# Patient Record
Sex: Female | Born: 1985 | Race: Black or African American | Hispanic: No | Marital: Single | Smoking: Never smoker
Health system: Southern US, Community
[De-identification: ages and names within clinical notes are randomized; demographics above are authoritative.]

## PROBLEM LIST (undated history)

## (undated) ENCOUNTER — Inpatient Hospital Stay (HOSPITAL_COMMUNITY): Payer: Self-pay

## (undated) DIAGNOSIS — F329 Major depressive disorder, single episode, unspecified: Secondary | ICD-10-CM

## (undated) DIAGNOSIS — F32A Depression, unspecified: Secondary | ICD-10-CM

## (undated) DIAGNOSIS — R87629 Unspecified abnormal cytological findings in specimens from vagina: Secondary | ICD-10-CM

## (undated) DIAGNOSIS — I10 Essential (primary) hypertension: Secondary | ICD-10-CM

## (undated) HISTORY — DX: Major depressive disorder, single episode, unspecified: F32.9

## (undated) HISTORY — DX: Depression, unspecified: F32.A

## (undated) HISTORY — PX: COLPOSCOPY: SHX161

---
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
## 2006-05-14 ENCOUNTER — Ambulatory Visit: Payer: Self-pay | Admitting: Obstetrics and Gynecology

## 2006-05-28 ENCOUNTER — Ambulatory Visit: Payer: Self-pay | Admitting: Obstetrics and Gynecology

## 2006-05-28 ENCOUNTER — Other Ambulatory Visit: Admission: RE | Admit: 2006-05-28 | Discharge: 2006-05-28 | Payer: Self-pay | Admitting: Obstetrics and Gynecology

## 2006-05-28 ENCOUNTER — Encounter (INDEPENDENT_AMBULATORY_CARE_PROVIDER_SITE_OTHER): Payer: Self-pay | Admitting: Specialist

## 2006-06-25 ENCOUNTER — Ambulatory Visit: Payer: Self-pay | Admitting: Obstetrics and Gynecology

## 2006-07-01 HISTORY — PX: LEEP: SHX91

## 2006-12-24 ENCOUNTER — Ambulatory Visit: Payer: Self-pay | Admitting: Gynecology

## 2006-12-24 ENCOUNTER — Encounter (INDEPENDENT_AMBULATORY_CARE_PROVIDER_SITE_OTHER): Payer: Self-pay | Admitting: Gynecology

## 2007-06-08 ENCOUNTER — Emergency Department (HOSPITAL_COMMUNITY): Admission: EM | Admit: 2007-06-08 | Discharge: 2007-06-08 | Payer: Self-pay | Admitting: Emergency Medicine

## 2007-07-19 ENCOUNTER — Emergency Department (HOSPITAL_COMMUNITY): Admission: EM | Admit: 2007-07-19 | Discharge: 2007-07-19 | Payer: Self-pay | Admitting: Emergency Medicine

## 2007-07-21 ENCOUNTER — Inpatient Hospital Stay (HOSPITAL_COMMUNITY): Admission: AD | Admit: 2007-07-21 | Discharge: 2007-07-21 | Payer: Self-pay | Admitting: Obstetrics & Gynecology

## 2007-07-31 ENCOUNTER — Inpatient Hospital Stay (HOSPITAL_COMMUNITY): Admission: RE | Admit: 2007-07-31 | Discharge: 2007-07-31 | Payer: Self-pay | Admitting: Obstetrics & Gynecology

## 2007-09-16 ENCOUNTER — Inpatient Hospital Stay (HOSPITAL_COMMUNITY): Admission: AD | Admit: 2007-09-16 | Discharge: 2007-09-16 | Payer: Self-pay | Admitting: Gynecology

## 2007-10-26 ENCOUNTER — Ambulatory Visit (HOSPITAL_COMMUNITY): Admission: RE | Admit: 2007-10-26 | Discharge: 2007-10-26 | Payer: Self-pay | Admitting: Family Medicine

## 2008-01-04 ENCOUNTER — Ambulatory Visit: Payer: Self-pay | Admitting: Obstetrics and Gynecology

## 2008-01-04 ENCOUNTER — Inpatient Hospital Stay (HOSPITAL_COMMUNITY): Admission: AD | Admit: 2008-01-04 | Discharge: 2008-01-04 | Payer: Self-pay | Admitting: Obstetrics & Gynecology

## 2008-02-09 ENCOUNTER — Inpatient Hospital Stay (HOSPITAL_COMMUNITY): Admission: AD | Admit: 2008-02-09 | Discharge: 2008-02-10 | Payer: Self-pay | Admitting: Family Medicine

## 2008-02-09 ENCOUNTER — Ambulatory Visit: Payer: Self-pay | Admitting: Advanced Practice Midwife

## 2008-03-22 ENCOUNTER — Inpatient Hospital Stay (HOSPITAL_COMMUNITY): Admission: AD | Admit: 2008-03-22 | Discharge: 2008-03-22 | Payer: Self-pay | Admitting: Family Medicine

## 2008-03-22 ENCOUNTER — Ambulatory Visit: Payer: Self-pay | Admitting: Family Medicine

## 2008-03-23 ENCOUNTER — Inpatient Hospital Stay (HOSPITAL_COMMUNITY): Admission: AD | Admit: 2008-03-23 | Discharge: 2008-03-25 | Payer: Self-pay | Admitting: Obstetrics & Gynecology

## 2008-03-23 ENCOUNTER — Ambulatory Visit: Payer: Self-pay | Admitting: Obstetrics & Gynecology

## 2008-10-04 IMAGING — US US OB TRANSVAGINAL
1 series · 14 of 27 positions shown · non-contrast
Comparison: none

OBSTETRICAL ULTRASOUND:

 This ultrasound exam was performed in the [HOSPITAL] Ultrasound Department.  The OB US report was generated in the AS system, and faxed to the ordering physician.  This report is also available in [REDACTED] PACS.

[Series 1: us ob transvaginal · 0.14mm/px · 14 of 27 slices shown]
[im 1/27]
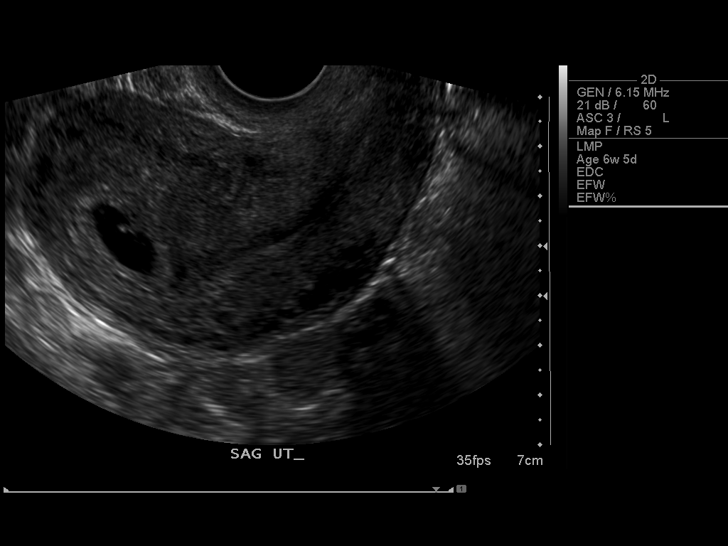
[im 3/27]
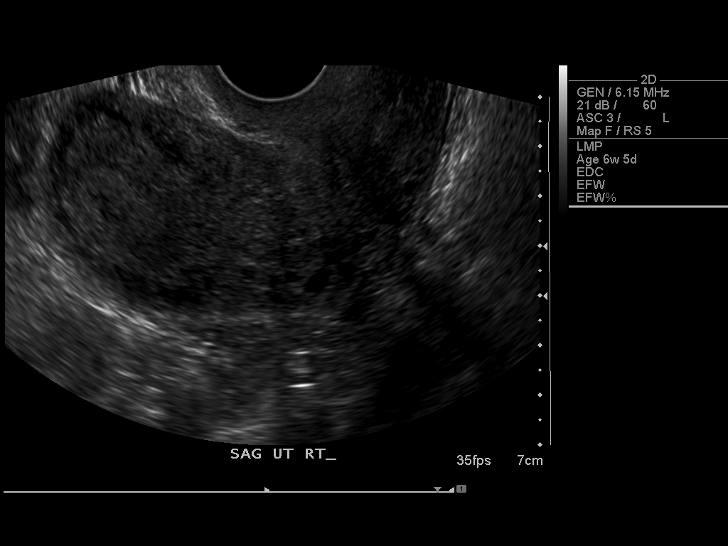
[im 5/27]
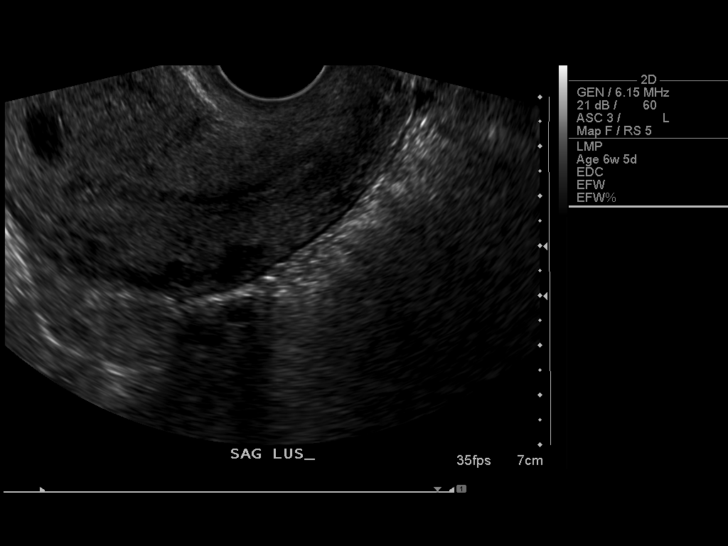
[im 7/27]
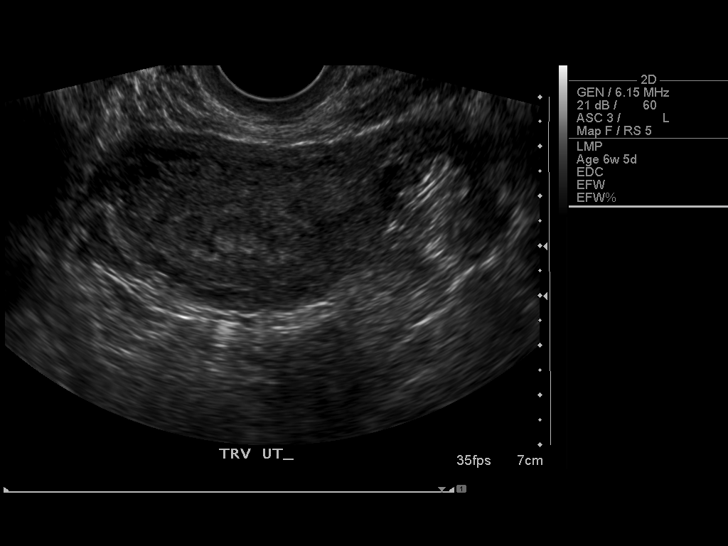
[im 9/27]
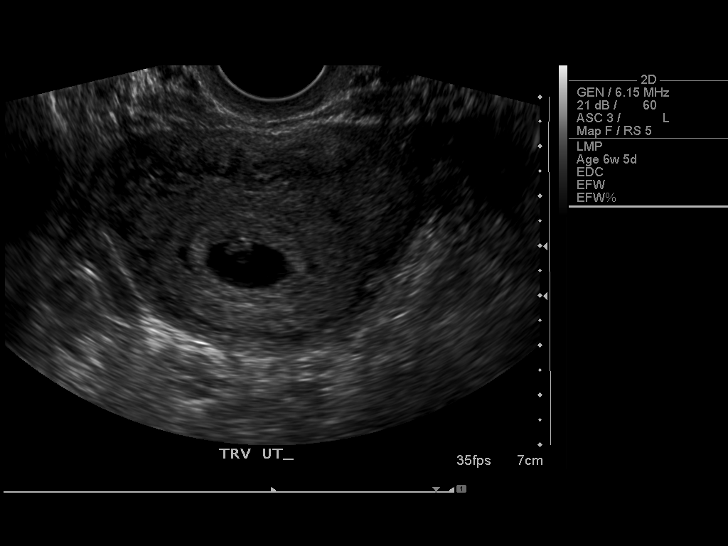
[im 11/27]
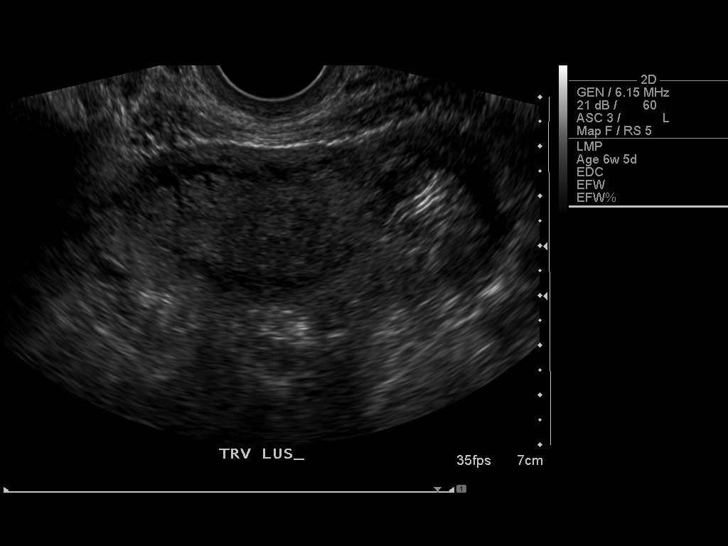
[im 13/27]
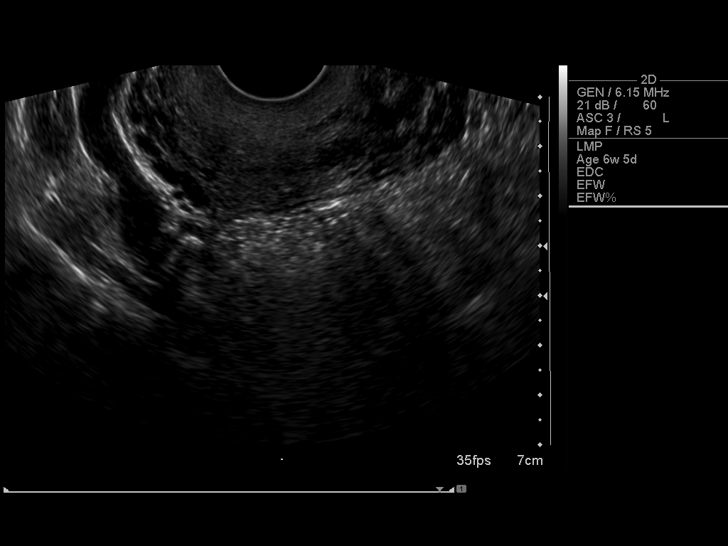
[im 15/27]
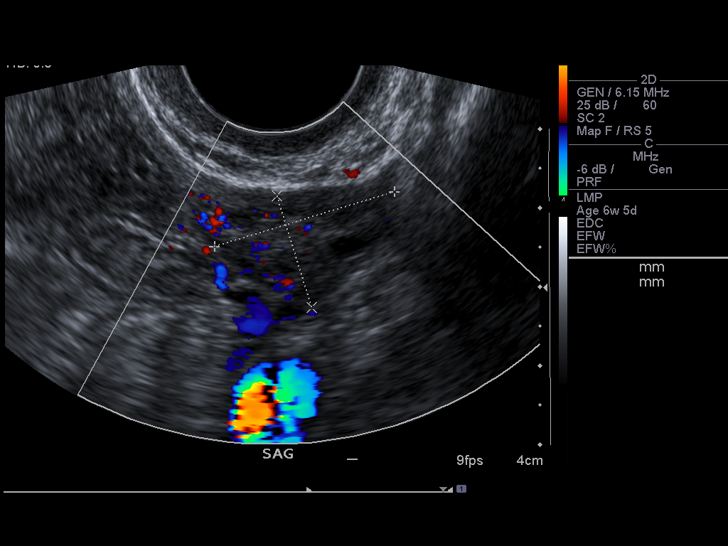
[im 17/27]
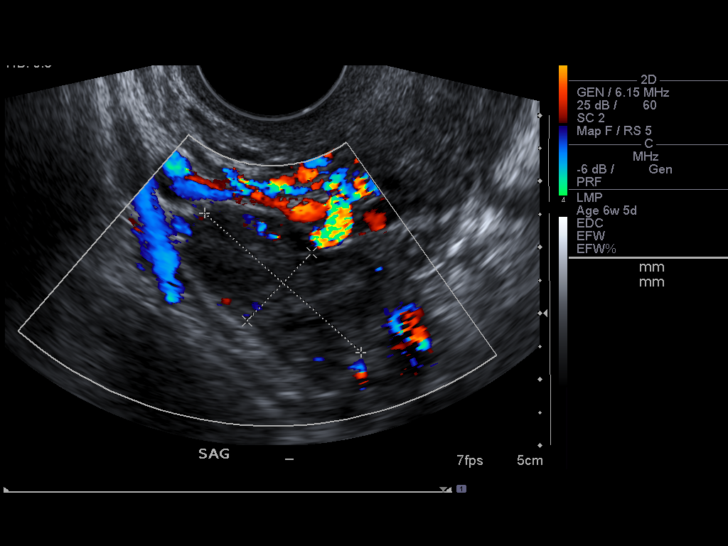
[im 19/27]
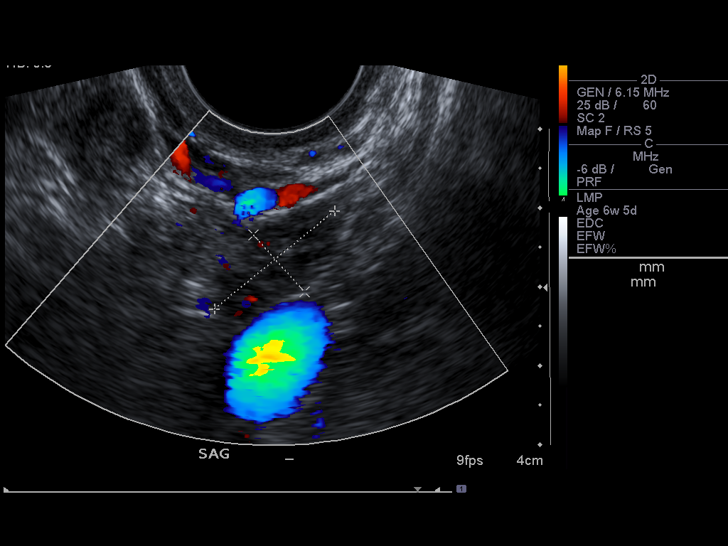
[im 21/27]
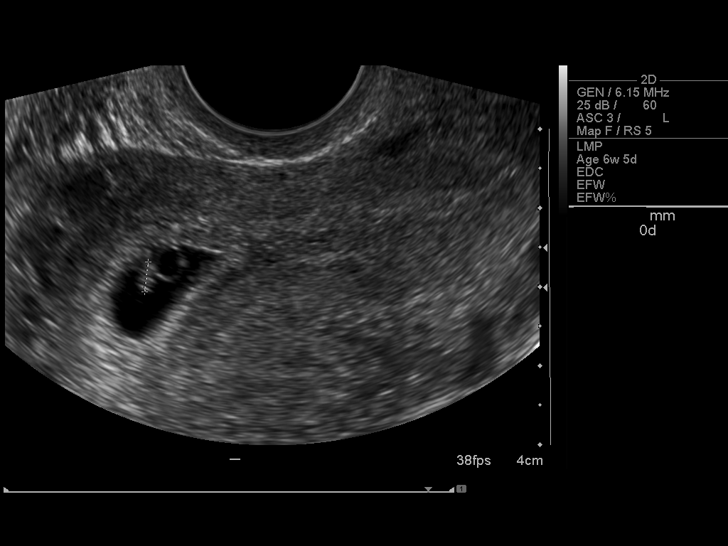
[im 23/27]
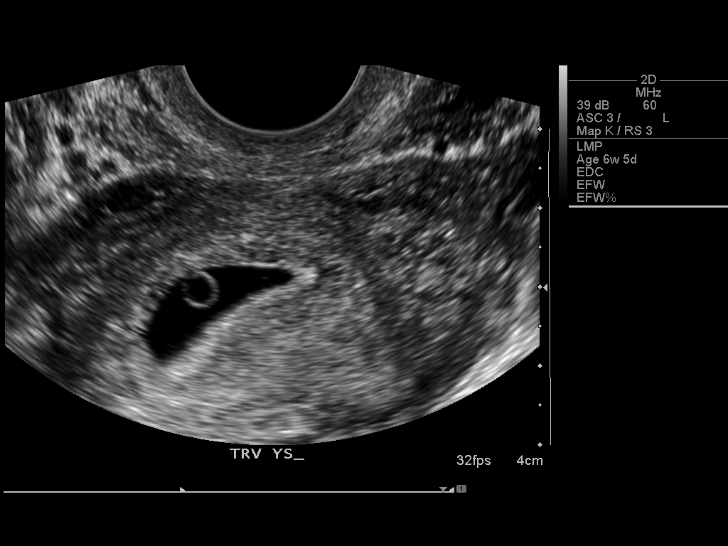
[im 25/27]
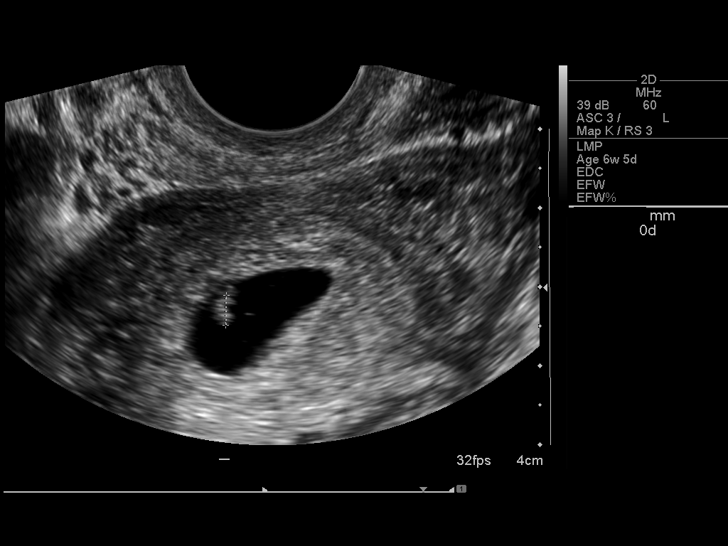
[im 27/27]
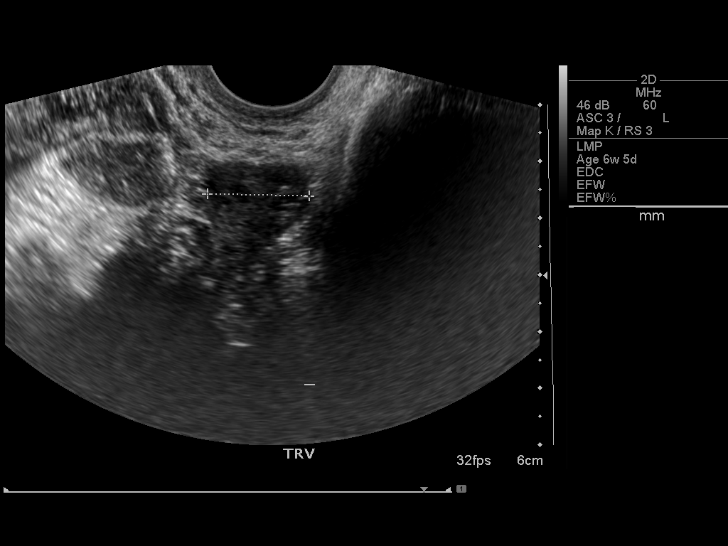

[14 of 27 positions shown; findings below may reference images not displayed]

IMPRESSION: See AS Obstetric US report.

## 2009-03-08 ENCOUNTER — Emergency Department (HOSPITAL_COMMUNITY): Admission: EM | Admit: 2009-03-08 | Discharge: 2009-03-08 | Payer: Self-pay | Admitting: Emergency Medicine

## 2010-10-05 LAB — URINE MICROSCOPIC-ADD ON

## 2010-10-05 LAB — URINALYSIS, ROUTINE W REFLEX MICROSCOPIC
Bilirubin Urine: NEGATIVE
Glucose, UA: NEGATIVE mg/dL
Hgb urine dipstick: NEGATIVE
Ketones, ur: 15 mg/dL — AB
Nitrite: POSITIVE — AB
Protein, ur: NEGATIVE mg/dL
Specific Gravity, Urine: 1.022 (ref 1.005–1.030)
Urobilinogen, UA: 1 mg/dL (ref 0.0–1.0)
pH: 7 (ref 5.0–8.0)

## 2010-10-05 LAB — URINE CULTURE: Colony Count: >=100000

## 2010-10-05 LAB — DIFFERENTIAL
Basophils Absolute: 0 10*3/uL (ref 0.0–0.1)
Basophils Relative: 0 % (ref 0–1)
Eosinophils Absolute: 0.1 10*3/uL (ref 0.0–0.7)
Eosinophils Relative: 1 % (ref 0–5)
Lymphocytes Relative: 29 % (ref 12–46)
Lymphs Abs: 2.6 10*3/uL (ref 0.7–4.0)
Monocytes Absolute: 0.9 10*3/uL (ref 0.1–1.0)
Monocytes Relative: 10 % (ref 3–12)
Neutro Abs: 5.3 10*3/uL (ref 1.7–7.7)
Neutrophils Relative %: 60 % (ref 43–77)

## 2010-10-05 LAB — BASIC METABOLIC PANEL
BUN: 7 mg/dL (ref 6–23)
CO2: 26 mEq/L (ref 19–32)
Calcium: 9.3 mg/dL (ref 8.4–10.5)
Chloride: 105 mEq/L (ref 96–112)
Creatinine, Ser: 0.72 mg/dL (ref 0.4–1.2)
GFR calc Af Amer: >60 mL/min (ref >60)
GFR calc non Af Amer: >60 mL/min (ref >60)
Glucose, Bld: 101 mg/dL — ABNORMAL HIGH (ref 70–99)
Potassium: 3.8 mEq/L (ref 3.5–5.1)
Sodium: 139 mEq/L (ref 135–145)

## 2010-10-05 LAB — CBC
HCT: 36.2 % (ref 36.0–46.0)
Hemoglobin: 12.3 g/dL (ref 12.0–15.0)
MCHC: 33.9 g/dL (ref 30.0–36.0)
MCV: 78.9 fL (ref 78.0–100.0)
Platelets: 181 10*3/uL (ref 150–400)
RBC: 4.59 MIL/uL (ref 3.87–5.11)
RDW: 12.4 % (ref 11.5–15.5)
WBC: 8.9 10*3/uL (ref 4.0–10.5)

## 2010-10-05 LAB — POCT PREGNANCY, URINE: Preg Test, Ur: NEGATIVE

## 2010-11-09 ENCOUNTER — Inpatient Hospital Stay (INDEPENDENT_AMBULATORY_CARE_PROVIDER_SITE_OTHER)
Admission: RE | Admit: 2010-11-09 | Discharge: 2010-11-09 | Disposition: A | Discharge status: Home / Self Care | Payer: Self-pay | Source: Ambulatory Visit | Attending: Emergency Medicine | Admitting: Emergency Medicine

## 2010-11-09 DIAGNOSIS — H109 Unspecified conjunctivitis: Secondary | ICD-10-CM

## 2010-11-13 NOTE — Group Therapy Note (Signed)
NAME:  Laura Downs, KRIGBAUM NO.:  [id number]   MEDICAL RECORD NO.:  [id number]          PATIENT TYPE:  WOC   LOCATION:  [facility]                   FACILITY:  [facility]   PHYSICIAN:  Ginger Carne, MD DATE OF BIRTH:  06/23/1986   DATE OF SERVICE:  12/24/2006                                  CLINIC NOTE   This is a 25 year old African American female here today for follow-up  Pap smear.  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  Cervix smooth without  erosions or lesions.  Pap smear is performed.  Uterus small, anteverted  and flexed and both adnexa palpable, found to be normal.   The patient had stopped her Depo-Provera contraceptive injection in  January 2008. Has no desire for contraception at this time is not  sexually active.  She has not had a menses to date and her urine  pregnancy test is pending.  She denies galactorrhea or headaches.  If  test is negative, the patient was advised that when she resumes  intercourse she should return for contraception.  The patient was  advised it may take up to 12-14 months before  menses resumes.           ______________________________  Ginger Carne, MD     SHB/MEDQ  D:  12/24/2006  T:  12/24/2006  Job:  [id number]

## 2010-11-16 NOTE — Group Therapy Note (Signed)
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   ON EXAMINATION:  The cervix looks as if she is a good candidate for clinic  LEEP procedure which we will be scheduling. I have talked to her about how  well she thinks she might tolerate this here in the clinic, and she thinks  she is able to do that. So will go ahead and schedule her in the near  future.  She should watch the film today and we answered the questions that  she had as well as explained to her in detail the possible complications as  well as the risk of preterm labor, postoperative bleeding, and infection.           ______________________________  Argentina Donovan, MD     PR/MEDQ  D:  05/14/2006  T:  05/14/2006  Job:  [id number]

## 2010-11-16 NOTE — Group Therapy Note (Signed)
NAME:  Laura Downs, Laura Downs NO.:  [id number]   MEDICAL RECORD NO.:  [id number]          PATIENT TYPE:  WOC   LOCATION:  [facility]                   FACILITY:  [facility]   PHYSICIAN:  Argentina Donovan, MD        DATE OF BIRTH:  Aug 16, 1985   DATE OF SERVICE:  05/28/2006                                  CLINIC NOTE   PROCEDURE:  Loop electrosurgical excision and conization of the cervix.   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   POSTOPERATIVE DIAGNOSIS:  Pending pathology report.   DESCRIPTION OF PROCEDURE:  With the patient in the dorsal lithotomy  position, an insulated speculum was placed in the vagina with lateral  wall speculum also and the cervix easily centered in the mid-portion of  both, injected with 1 mL of 1% Xylocaine with 1:200,000 epinephrine in 4  quadrants at 2, 4, 8 and 10 o'clock.  Using a Fischer LEEP extractor  with a 60-watt current at cutting 1 blend, a conization was taken; a 50  coagulation was then used to coagulate around the entire site as well as  in the biopsy site for hemostasis.  The speculum was removed and the  patient tolerated the procedure well and was discharged to be followed  in 2 weeks.           ______________________________  Argentina Donovan, MD     PR/MEDQ  D:  05/28/2006  T:  05/29/2006  Job:  [id number]

## 2011-03-21 LAB — CBC
HCT: 42.1
Hemoglobin: 14.7
MCHC: 34.8
MCV: 89.3
Platelets: 295
RBC: 4.71
RDW: 12.9
WBC: 5.3

## 2011-03-21 LAB — WET PREP, GENITAL
Trich, Wet Prep: NONE SEEN
WBC, Wet Prep HPF POC: NONE SEEN
Yeast Wet Prep HPF POC: NONE SEEN

## 2011-03-21 LAB — BASIC METABOLIC PANEL
BUN: 6
CO2: 26
Calcium: 9.7
Chloride: 106
Creatinine, Ser: 0.74
GFR calc Af Amer: >60
GFR calc non Af Amer: >60
Glucose, Bld: 97
Potassium: 3.5
Sodium: 140

## 2011-03-21 LAB — RPR: RPR Ser Ql: NONREACTIVE

## 2011-03-21 LAB — URINALYSIS, ROUTINE W REFLEX MICROSCOPIC
Bilirubin Urine: NEGATIVE
Glucose, UA: NEGATIVE
Hgb urine dipstick: NEGATIVE
Nitrite: NEGATIVE
Protein, ur: NEGATIVE
Specific Gravity, Urine: 1.028
Urobilinogen, UA: 1
pH: 6.5

## 2011-03-21 LAB — HCG, QUANTITATIVE, PREGNANCY
hCG, Beta Chain, Quant, S: 3234 — ABNORMAL HIGH
hCG, Beta Chain, Quant, S: 1604 — ABNORMAL HIGH

## 2011-03-21 LAB — DIFFERENTIAL
Basophils Absolute: 0
Eosinophils Relative: 3
Lymphocytes Relative: 37
Lymphs Abs: 1.9
Monocytes Absolute: 0.5
Neutro Abs: 2.7

## 2011-03-21 LAB — URINE MICROSCOPIC-ADD ON

## 2011-03-21 LAB — PREGNANCY, URINE: Preg Test, Ur: POSITIVE

## 2011-03-21 LAB — GC/CHLAMYDIA PROBE AMP, GENITAL
Chlamydia, DNA Probe: NEGATIVE
GC Probe Amp, Genital: NEGATIVE

## 2011-03-25 LAB — URINALYSIS, ROUTINE W REFLEX MICROSCOPIC
Protein, ur: NEGATIVE
Specific Gravity, Urine: 1.02
Urobilinogen, UA: 0.2

## 2011-03-28 LAB — URINALYSIS, ROUTINE W REFLEX MICROSCOPIC
Bilirubin Urine: NEGATIVE
Ketones, ur: NEGATIVE
Nitrite: NEGATIVE
Protein, ur: NEGATIVE
Urobilinogen, UA: 0.2

## 2011-03-28 LAB — FETAL FIBRONECTIN: Fetal Fibronectin: NEGATIVE

## 2011-03-29 LAB — URINALYSIS, ROUTINE W REFLEX MICROSCOPIC
Bilirubin Urine: NEGATIVE
Hgb urine dipstick: NEGATIVE
Ketones, ur: NEGATIVE
Nitrite: NEGATIVE
Protein, ur: NEGATIVE
Urobilinogen, UA: 0.2

## 2011-04-01 LAB — CBC
MCHC: 32.9
MCV: 88.8
Platelets: 257
RBC: 4.2
RDW: 14.4

## 2011-04-01 LAB — RPR: RPR Ser Ql: NONREACTIVE

## 2011-11-12 ENCOUNTER — Encounter: Payer: Medicaid Other | Admitting: Nurse Practitioner

## 2011-11-12 DIAGNOSIS — Z30017 Encounter for initial prescription of implantable subdermal contraceptive: Secondary | ICD-10-CM

## 2012-07-20 ENCOUNTER — Encounter: Payer: Medicaid Other | Admitting: Obstetrics and Gynecology

## 2012-07-30 ENCOUNTER — Encounter: Payer: Medicaid Other | Admitting: Obstetrics and Gynecology

## 2012-08-12 ENCOUNTER — Encounter: Payer: Self-pay | Admitting: Obstetrics and Gynecology

## 2012-08-12 ENCOUNTER — Ambulatory Visit (INDEPENDENT_AMBULATORY_CARE_PROVIDER_SITE_OTHER): Payer: Medicaid Other | Admitting: Obstetrics and Gynecology

## 2012-08-12 VITALS — BP 135/82 | HR 78 | Ht 63.5 in | Wt 183.0 lb

## 2012-08-12 DIAGNOSIS — Z30017 Encounter for initial prescription of implantable subdermal contraceptive: Secondary | ICD-10-CM

## 2012-08-12 DIAGNOSIS — Z3046 Encounter for surveillance of implantable subdermal contraceptive: Secondary | ICD-10-CM

## 2012-08-12 NOTE — Progress Notes (Signed)
Patient ID: Laura Downs, female   DOB: 09-30-1985, 27 y.o.   MRN: [id number] 27 yo G2P2 presenting today for Implanon removal and Nexplanon insertion. Patient has been very pleased with this form of birth control.   Removal Patient given informed consent for removal of her Implanon and reinsertion, time out was performed.  Signed copy in the chart.  Appropriate time out taken. Implanon site identified.  Area prepped in usual sterile fashon. One cc of 1% lidocaine was used to anesthetize the area at the distal end of the implant. A small stab incision was made right beside the implant on the distal portion.  The implanon rod was grasped using hemostats and removed without difficulty.  There was less than 3 cc blood loss. There were no complications.   Insertion Patient's right arm was prepped and draped in the usual sterile fashion. The incision site used for implanon removal was used for insertion as well. Nexplanon removed form packaging,  Device confirmed in needle, then inserted full length of needle and withdrawn per handbook instructions.  Pt insertion site covered with pressure dressing.   Minimal blood loss.  Pt tolerated the procedure well and was given post procedure instructions

## 2012-09-16 ENCOUNTER — Ambulatory Visit: Payer: Medicaid Other | Admitting: Obstetrics and Gynecology

## 2012-12-04 ENCOUNTER — Encounter: Payer: Medicaid Other | Admitting: Obstetrics and Gynecology

## 2013-09-05 ENCOUNTER — Emergency Department (HOSPITAL_COMMUNITY)
Admission: EM | Admit: 2013-09-05 | Discharge: 2013-09-05 | Disposition: A | Discharge status: Home / Self Care | Payer: Medicaid Other | Attending: Emergency Medicine | Admitting: Emergency Medicine

## 2013-09-05 ENCOUNTER — Encounter (HOSPITAL_COMMUNITY): Payer: Self-pay | Admitting: Emergency Medicine

## 2013-09-05 DIAGNOSIS — H109 Unspecified conjunctivitis: Secondary | ICD-10-CM | POA: Insufficient documentation

## 2013-09-05 MED ORDER — POLYMYXIN B-TRIMETHOPRIM 10000-0.1 UNIT/ML-% OP SOLN: 1.0000 [drp] | OPHTHALMIC | Status: DC

## 2013-09-05 NOTE — ED Notes (Signed)
Pt. Stated, My rt. Eye started swelling yesterday and this morning it was swollen shut.  I also have something that has little bites on me and i don't know where its coming from

## 2013-09-05 NOTE — Discharge Instructions (Signed)
Use polytrim drops as directed until symptoms resolve. Continue to use benadryl. Return to the ED with worsening or concerning symptoms. Refer to attached documents for more information.

## 2013-09-05 NOTE — ED Provider Notes (Signed)
Medical screening examination/treatment/procedure(s) were performed by non-physician practitioner and as supervising physician I was immediately available for consultation/collaboration.   EKG Interpretation None        Davia Smyre S Sarahgrace Broman, MD 09/05/13 2107 

## 2013-09-05 NOTE — ED Provider Notes (Signed)
CSN: [id number]     Arrival date & time 09/05/13  1020 History  This chart was scribed for Emilia BeckKaitlyn Gregroy Dombkowski, PA working with Junius ArgyleForrest S Harrison, MD by Quintella ReichertMatthew Underwood, ED Scribe. This patient was seen in room TR04C/TR04C and the patient's care was started at 10:56 AM.  Chief Complaint  Patient presents with  . Facial Swelling    The history is provided by the patient. No language interpreter was used.    HPI Comments: Laura Downs is a 28 y.o. female who presents to the Emergency Department complaining of right eyelid swelling that began last night.  Pt reports that she had mild swelling to her right eyelids last night.  She took Benadryl and went to sleep but this morning on waking her eye was "swollen shut" with discharge.  She denies pain to the eye.  She notes that 2 days ago her left eye also appeared slightly "pinkish-red" but this has since resolved.  She denies injury or FB in eye.  Pt notes that she has also had some itching in her ears bilaterally.     History reviewed. No pertinent past medical history.   Past Surgical History  Procedure Laterality Date  . Leep  2008    paps have been normal since.    No family history on file.   History  Substance Use Topics  . Smoking status: Never Smoker   . Smokeless tobacco: Never Used  . Alcohol Use: No    OB History   Grav Para Term Preterm Abortions TAB SAB Ect Mult Living   2 2 2  0 0 0 0 0 0 2       Review of Systems  HENT:       Ear itching  Eyes: Positive for discharge and redness. Negative for pain.       Right eyelid swelling  All other systems reviewed and are negative.      Allergies  Review of patient's allergies indicates no known allergies.  Home Medications   Current Outpatient Rx  Name  Route  Sig  Dispense  Refill  . Etonogestrel (IMPLANON [place])   Subcutaneous   Inject into the skin.          BP 126/69  Pulse 77  Temp(Src) 98.1 F (36.7 C) (Oral)  Resp 16  SpO2 98%  Physical  Exam  Nursing note and vitals reviewed. Constitutional: She is oriented to person, place, and time. She appears well-developed and well-nourished. No distress.  HENT:  Head: Normocephalic and atraumatic.  Right Ear: Tympanic membrane normal.  Left Ear: Tympanic membrane normal.  No rash noted on ears.  Eyes: EOM are normal. Right conjunctiva is injected.  Right periorbital swelling, without tenderness to palpation.  Mild conjunctival injection.  No drainage noted.  Small pustule at distal right eyebrow that is nontender to palpate.    Neck: Neck supple. No tracheal deviation present.  Cardiovascular: Normal rate.   Pulmonary/Chest: Effort normal. No respiratory distress.  Musculoskeletal: Normal range of motion.  Neurological: She is alert and oriented to person, place, and time.  Skin: Skin is warm and dry.  Psychiatric: She has a normal mood and affect. Her behavior is normal.    ED Course  Procedures (including critical care time)  DIAGNOSTIC STUDIES: Oxygen Saturation is 98% on room air, normal by my interpretation.    COORDINATION OF CARE: 11:00 AM-Discussed treatment plan which includes antibiotic eye drops with pt at bedside and pt agreed to plan.  Labs Review Labs Reviewed - No data to display  Imaging Review No results found.   EKG Interpretation None      MDM   Final diagnoses:  Conjunctivitis of right eye    11:05 AM Patient likely has conjunctivitis and will be treated with polytrim drops. Vitals stable and patient afebrile. No further evaluation needed at this time.    I personally performed the services described in this documentation, which was scribed in my presence. The recorded information has been reviewed and is accurate.   Emilia Beck, PA-C 09/05/13 1105

## 2014-04-27 ENCOUNTER — Emergency Department (HOSPITAL_COMMUNITY): Payer: Medicaid Other

## 2014-04-27 ENCOUNTER — Encounter (HOSPITAL_COMMUNITY): Payer: Self-pay | Admitting: Emergency Medicine

## 2014-04-27 ENCOUNTER — Emergency Department (HOSPITAL_COMMUNITY)
Admission: EM | Admit: 2014-04-27 | Discharge: 2014-04-27 | Disposition: A | Discharge status: Home / Self Care | Payer: Self-pay | Attending: Emergency Medicine | Admitting: Emergency Medicine

## 2014-04-27 DIAGNOSIS — Y939 Activity, unspecified: Secondary | ICD-10-CM | POA: Insufficient documentation

## 2014-04-27 DIAGNOSIS — T788XXA Other adverse effects, not elsewhere classified, initial encounter: Secondary | ICD-10-CM | POA: Insufficient documentation

## 2014-04-27 DIAGNOSIS — M7989 Other specified soft tissue disorders: Secondary | ICD-10-CM

## 2014-04-27 DIAGNOSIS — Y929 Unspecified place or not applicable: Secondary | ICD-10-CM | POA: Insufficient documentation

## 2014-04-27 DIAGNOSIS — T7840XA Allergy, unspecified, initial encounter: Secondary | ICD-10-CM

## 2014-04-27 DIAGNOSIS — R2232 Localized swelling, mass and lump, left upper limb: Secondary | ICD-10-CM | POA: Insufficient documentation

## 2014-04-27 DIAGNOSIS — X58XXXA Exposure to other specified factors, initial encounter: Secondary | ICD-10-CM | POA: Insufficient documentation

## 2014-04-27 LAB — COMPREHENSIVE METABOLIC PANEL
ALBUMIN: 3.7 g/dL (ref 3.5–5.2)
ALK PHOS: 62 U/L (ref 39–117)
ALT: 9 U/L (ref 0–35)
AST: 14 U/L (ref 0–37)
Anion gap: 12 (ref 5–15)
BILIRUBIN TOTAL: 0.5 mg/dL (ref 0.3–1.2)
BUN: 8 mg/dL (ref 6–23)
CHLORIDE: 103 meq/L (ref 96–112)
CO2: 24 meq/L (ref 19–32)
CREATININE: 0.83 mg/dL (ref 0.50–1.10)
Calcium: 8.9 mg/dL (ref 8.4–10.5)
GFR calc Af Amer: >90 mL/min (ref >90)
Glucose, Bld: 82 mg/dL (ref 70–99)
POTASSIUM: 3.3 meq/L — AB (ref 3.7–5.3)
Sodium: 139 mEq/L (ref 137–147)
Total Protein: 7.8 g/dL (ref 6.0–8.3)

## 2014-04-27 LAB — CBC WITH DIFFERENTIAL/PLATELET
BASOS ABS: 0 10*3/uL (ref 0.0–0.1)
Basophils Relative: 0 % (ref 0–1)
Eosinophils Absolute: 0.3 10*3/uL (ref 0.0–0.7)
Eosinophils Relative: 5 % (ref 0–5)
HEMATOCRIT: 39.1 % (ref 36.0–46.0)
HEMOGLOBIN: 13.3 g/dL (ref 12.0–15.0)
LYMPHS PCT: 43 % (ref 12–46)
Lymphs Abs: 2.2 10*3/uL (ref 0.7–4.0)
MCH: 30.1 pg (ref 26.0–34.0)
MCHC: 34 g/dL (ref 30.0–36.0)
MCV: 88.5 fL (ref 78.0–100.0)
MONO ABS: 0.4 10*3/uL (ref 0.1–1.0)
MONOS PCT: 7 % (ref 3–12)
NEUTROS ABS: 2.3 10*3/uL (ref 1.7–7.7)
NEUTROS PCT: 45 % (ref 43–77)
Platelets: 264 10*3/uL (ref 150–400)
RBC: 4.42 MIL/uL (ref 3.87–5.11)
RDW: 12.8 % (ref 11.5–15.5)
WBC: 5.2 10*3/uL (ref 4.0–10.5)

## 2014-04-27 LAB — I-STAT CG4 LACTIC ACID, ED: LACTIC ACID, VENOUS: 0.71 mmol/L (ref 0.5–2.2)

## 2014-04-27 MED ORDER — POTASSIUM CHLORIDE CRYS ER 20 MEQ PO TBCR
20.0000 meq | EXTENDED_RELEASE_TABLET | Freq: Once | ORAL | Status: AC
  Administered 2014-04-27: 20 meq via ORAL
  Filled 2014-04-27: qty 1

## 2014-04-27 MED ORDER — DIPHENHYDRAMINE HCL 25 MG PO TABS: 25.0000 mg | ORAL_TABLET | Freq: Four times a day (QID) | ORAL | Status: DC

## 2014-04-27 MED ORDER — PREDNISONE 10 MG PO TABS: 40.0000 mg | ORAL_TABLET | Freq: Every day | ORAL | Status: DC

## 2014-04-27 MED ORDER — DIPHENHYDRAMINE HCL 25 MG PO CAPS
25.0000 mg | ORAL_CAPSULE | Freq: Once | ORAL | Status: AC
  Administered 2014-04-27: 25 mg via ORAL
  Filled 2014-04-27: qty 1

## 2014-04-27 MED ORDER — PREDNISONE 20 MG PO TABS
40.0000 mg | ORAL_TABLET | Freq: Once | ORAL | Status: AC
  Administered 2014-04-27: 40 mg via ORAL
  Filled 2014-04-27: qty 2

## 2014-04-27 NOTE — ED Provider Notes (Signed)
CSN: [id number]     Arrival date & time 04/27/14  1724 History   First MD Initiated Contact with Patient 04/27/14 1933     Chief Complaint  Patient presents with  . Arm Pain     Patient is a 28 y.o. female presenting with rash. The history is provided by the patient.  Rash Location:  Shoulder/arm Shoulder/arm rash location:  L forearm Quality: blistering, itchiness and swelling   Quality: not bruising, not burning, not dry, not painful and not peeling   Severity:  Severe Onset quality:  Gradual Duration:  3 days Timing:  Constant Progression:  Worsening Chronicity:  New Context: not animal contact, not chemical exposure, not exposure to similar rash, not medications and not new detergent/soap   Relieved by:  None tried Worsened by:  Contact Ineffective treatments:  None tried Associated symptoms: no abdominal pain, no diarrhea, no fever, no headaches, no joint pain, no nausea, no sore throat, no throat swelling, no tongue swelling, no URI and not vomiting     History reviewed. No pertinent past medical history. Past Surgical History  Procedure Laterality Date  . Leep  2008    paps have been normal since.   History reviewed. No pertinent family history. History  Substance Use Topics  . Smoking status: Never Smoker   . Smokeless tobacco: Never Used  . Alcohol Use: No   OB History   Grav Para Term Preterm Abortions TAB SAB Ect Mult Living   2 2 2  0 0 0 0 0 0 2     Review of Systems  Constitutional: Negative for fever.  HENT: Negative for sore throat.   Gastrointestinal: Negative for nausea, vomiting, abdominal pain and diarrhea.  Musculoskeletal: Negative for arthralgias.  Skin: Positive for rash.  Neurological: Negative for headaches.  All other systems reviewed and are negative.    Allergies  Review of patient's allergies indicates no known allergies.  Home Medications   Prior to Admission medications   Medication Sig Start Date End Date Taking?  Authorizing Provider  Etonogestrel (IMPLANON [place]) Inject into the skin.   Yes Historical Provider, MD   BP 129/70  Pulse 86  Temp(Src) 97.5 F (36.4 C) (Oral)  Resp 18  SpO2 98% Physical Exam  Constitutional: She is oriented to person, place, and time. She appears well-developed and well-nourished. She is cooperative. No distress.  HENT:  Head: Normocephalic and atraumatic.  Right Ear: External ear normal.  Left Ear: External ear normal.  Neck: Normal range of motion and phonation normal.  Cardiovascular: Normal rate and regular rhythm.   Pulmonary/Chest: Effort normal and breath sounds normal. No respiratory distress. She has no wheezes. She has no rales.  Abdominal: Soft. She exhibits no distension. There is no tenderness. There is no rebound and no guarding.  Musculoskeletal:       Arms: Neurological: She is alert and oriented to person, place, and time.  Skin: Skin is warm and dry. No rash noted. She is not diaphoretic.    ED Course  Procedures  Labs Review  Results for orders placed during the hospital encounter of 04/27/14  CBC WITH DIFFERENTIAL      Result Value Ref Range   WBC 5.2  4.0 - 10.5 K/uL   RBC 4.42  3.87 - 5.11 MIL/uL   Hemoglobin 13.3  12.0 - 15.0 g/dL   HCT 40.939.1  81.136.0 - 91.446.0 %   MCV 88.5  78.0 - 100.0 fL   MCH 30.1  26.0 - 34.0  pg   MCHC 34.0  30.0 - 36.0 g/dL   RDW 16.112.8  09.611.5 - 04.515.5 %   Platelets 264  150 - 400 K/uL   Neutrophils Relative % 45  43 - 77 %   Neutro Abs 2.3  1.7 - 7.7 K/uL   Lymphocytes Relative 43  12 - 46 %   Lymphs Abs 2.2  0.7 - 4.0 K/uL   Monocytes Relative 7  3 - 12 %   Monocytes Absolute 0.4  0.1 - 1.0 K/uL   Eosinophils Relative 5  0 - 5 %   Eosinophils Absolute 0.3  0.0 - 0.7 K/uL   Basophils Relative 0  0 - 1 %   Basophils Absolute 0.0  0.0 - 0.1 K/uL  COMPREHENSIVE METABOLIC PANEL      Result Value Ref Range   Sodium 139  137 - 147 mEq/L   Potassium 3.3 (*) 3.7 - 5.3 mEq/L   Chloride 103  96 - 112 mEq/L   CO2 24  19  - 32 mEq/L   Glucose, Bld 82  70 - 99 mg/dL   BUN 8  6 - 23 mg/dL   Creatinine, Ser 4.090.83  0.50 - 1.10 mg/dL   Calcium 8.9  8.4 - 81.110.5 mg/dL   Total Protein 7.8  6.0 - 8.3 g/dL   Albumin 3.7  3.5 - 5.2 g/dL   AST 14  0 - 37 U/L   ALT 9  0 - 35 U/L   Alkaline Phosphatase 62  39 - 117 U/L   Total Bilirubin 0.5  0.3 - 1.2 mg/dL   GFR calc non Af Amer >90  >90 mL/min   GFR calc Af Amer >90  >90 mL/min   Anion gap 12  5 - 15  I-STAT CG4 LACTIC ACID, ED      Result Value Ref Range   Lactic Acid, Venous 0.71  0.5 - 2.2 mmol/L     Imaging Review Dg Forearm Left  04/27/2014   CLINICAL DATA:  Multiple small wounds on forearm. Swelling. Symptoms started 2 days ago.  EXAM: LEFT FOREARM - 2 VIEW  COMPARISON:  None.  FINDINGS: There is no evidence of fracture or other focal bone lesions. Soft tissues are unremarkable.  IMPRESSION: Negative.   Electronically Signed   By: Charlett NoseKevin  Dover M.D.   On: 04/27/2014 20:39    MDM   Final diagnoses:  Left arm swelling  Allergic reaction, initial encounter    28 y.o. otherwise healthy female presents to the ED due to left arm itching, swelling, blister formation. Denies any history of same. Denies pain, fever, chills, chest pain, shortness of breath, nausea, vomiting, diarrhea, oropharyngeal swelling.  X-ray obtained which showed no evidence of fracture. I have no concern for compartment syndrome, DVT. Her history and presentation are consistent with an allergic reaction versus contact dermatitis. She was given Benadryl and prednisone in the ED with complete relief of her itching.  I discussed very strict return precautions with the patient which were given in writing as well. She was discharged with a course of Benadryl and prednisone. She is to follow-up with the primary care physician (resources given).  This case was managed in conjunction with my attending, Dr. Patria Maneampos.    Maxine GlennAnn Jailyn Langhorst, MD 04/28/14 [phone]

## 2014-04-27 NOTE — ED Notes (Signed)
Pt verbalizes understanding of d/c instructions and directions for follow-up care. Pt voices no additional concerns or questions at this time. 

## 2014-04-27 NOTE — ED Notes (Signed)
Patient transported to X-ray 

## 2014-04-27 NOTE — ED Notes (Signed)
Pt having left arm pain, swelling and blisters since Monday. Pt left FA very tight, swollen. sts she has popped multiple blisters.

## 2014-04-27 NOTE — ED Notes (Signed)
Pt states she noted her left arm started itching yesterday and a few blisters "popped up" over the last 24 hrs, as well as some swelling noticed in her left forearm. Pt states she is not in any pain, but the swelling is causing some discomfort.

## 2014-04-27 NOTE — ED Notes (Signed)
Pt remains monitored by blood pressure and pulse ox.  

## 2014-04-27 NOTE — ED Notes (Signed)
Pt placed into gown and on monitor upon arrival to room.  Pt monitored by blood pressure and pulse  Ox.

## 2014-04-27 NOTE — Discharge Instructions (Signed)
Take benadryl every 6 hours for itching. Return to the ED for any new or worsening symptoms such as difficulty breathing, fever, chills, nausea, vomiting, or any other concerns. Follow up with a primary care provider. See resource guide below to establish with a primary care provider.   Rash A rash is a change in the color or texture of your skin. There are many different types of rashes. You may have other problems that accompany your rash. CAUSES   Infections.  Allergic reactions. This can include allergies to pets or foods.  Certain medicines.  Exposure to certain chemicals, soaps, or cosmetics.  Heat.  Exposure to poisonous plants.  Tumors, both cancerous and noncancerous. SYMPTOMS   Redness.  Scaly skin.  Itchy skin.  Dry or cracked skin.  Bumps.  Blisters.  Pain. DIAGNOSIS  Your caregiver may do a physical exam to determine what type of rash you have. A skin sample (biopsy) may be taken and examined under a microscope. TREATMENT  Treatment depends on the type of rash you have. Your caregiver may prescribe certain medicines. For serious conditions, you may need to see a skin doctor (dermatologist). HOME CARE INSTRUCTIONS   Avoid the substance that caused your rash.  Do not scratch your rash. This can cause infection.  You may take cool baths to help stop itching.  Only take over-the-counter or prescription medicines as directed by your caregiver.  Keep all follow-up appointments as directed by your caregiver. SEEK IMMEDIATE MEDICAL CARE IF:  You have increasing pain, swelling, or redness.  You have a fever.  You have new or severe symptoms.  You have body aches, diarrhea, or vomiting.  Your rash is not better after 3 days. MAKE SURE YOU:  Understand these instructions.  Will watch your condition.  Will get help right away if you are not doing well or get worse. Document Released: 06/07/2002 Document Revised: 09/09/2011 Document Reviewed:  04/01/2011 [facility] Patient Information 2015 [employer], [place]. This information is not intended to replace advice given to you by your health care provider. Make sure you discuss any questions you have with your health care provider.    Emergency Department Resource Guide 1) Find a Doctor and Pay Out of Pocket Although you won't have to find out who is covered by your insurance plan, it is a good idea to ask around and get recommendations. You will then need to call the office and see if the doctor you have chosen will accept you as a new patient and what types of options they offer for patients who are self-pay. Some doctors offer discounts or will set up payment plans for their patients who do not have insurance, but you will need to ask so you aren't surprised when you get to your appointment.  2) Contact Your Local Health Department Not all health departments have doctors that can see patients for sick visits, but many do, so it is worth a call to see if yours does. If you don't know where your local health department is, you can check in your phone book. The CDC also has a tool to help you locate your state's health department, and many state websites also have listings of all of their local health departments.  3) Find a Walk-in Clinic If your illness is not likely to be very severe or complicated, you may want to try a walk in clinic. These are popping up all over the country in pharmacies, drugstores, and shopping centers. They're usually staffed by nurse  practitioners or physician assistants that have been trained to treat common illnesses and complaints. They're usually fairly quick and inexpensive. However, if you have serious medical issues or chronic medical problems, these are probably not your best option.  No Primary Care Doctor: - Call Health Connect at  [phone] - they can help you locate a primary care doctor that  accepts your insurance, provides certain services, etc. - Physician  Referral Service- [phone]  Chronic Pain Problems: Organization         Address  Phone   Notes  [facility]  [phone] Patients need to be referred by their primary care doctor.   Medication Assistance: Organization         Address  Phone   Notes  [facility] Medication [facility] [address] [place] [place], [place] [address] [phone] --Must be a resident of [facility] -- Must have NO insurance coverage whatsoever (no Medicaid/ Medicare, etc.) -- The pt. MUST have a primary care doctor that directs their care regularly and follows them in the community   [facility]  [phone]   [employer]  [phone]    Agencies that provide inexpensive medical care: Organization         Address  Phone   Notes  [facility]  [phone]   [facility]    [phone]   [facility] [address] [place], [place] [address] [phone]   [facility] [address] [place] [phone]   Planned Parenthood    [phone]   [facility]    [phone]   [facility] and [facility]  [address], [place] Phone:  [phone], Fax:  [phone] Hours of Operation:  9 am - 6 pm, M-F.  Also accepts Medicaid/Medicare and self-pay.  [facility]  [address], [place] Phone: [phone], Fax: [phone]. Hours of Operation:  8:30 am - 5:30 pm, M-F.  Also accepts Medicaid and self-pay.  [facility] [place] [address], [place] Phone: [phone]   [facility] [address] Natasha Bence [place], [place] [phone], Ext. [phone] Mondays & Thursdays: 7-9 AM.  First 15 patients are seen on a first come, first serve basis.    Medicaid-accepting [facility] Providers:  Organization         Address  Phone   Notes  [facility] [address], [place] [phone] Also accepts self-pay patients.  [facility] [address] Laurell Josephs [place], [place]  [phone]   [facility] [address], [place] [phone]   [facility] [address], [place] [phone]   Renaye Rakers [address], [place]   [phone] Only accepts [place] Access [place] patients after they have their name applied to their card.   Self-Pay (no insurance) in [facility]:  Organization         Address  Phone   Notes  Sickle Cell Patients, [facility] Medicine [address] [place], [place] [phone]   [facility] [address] [place], [place] [phone]   [facility] [place]  [address], [place] [phone]   [facility]/Dr. Julio Sicks  [address], [place] or [address], [place] [phone] Phone number for both [place] and [place] locations is the same.  [facility] [address] [place] [phone]   [facility] [address], [place] or [address] [phone]   [facility] [address] [place] [phone], phone; [phone], fax Sees patients 1st and 3rd Saturday of every month.  Must not qualify for public or private insurance (i.e. Medicaid, Medicare, [place] Health Choice, Veterans' Benefits)  Household income should be no more than 200% of the poverty level The clinic cannot treat you if you are pregnant or think you are pregnant  Sexually transmitted diseases are not treated at the clinic.    Dental Care: Organization         Address  Phone  Notes  [facility] Department of [facility] [address] [place], [place] [phone] Accepts children up to age 103 who are enrolled  in [place] or [place] Health Choice; pregnant women with a Medicaid card; and children who have applied for Medicaid or [place] Health Choice, but were declined, whose parents can pay a reduced fee at time of service.  [facility] Department of [facility]  [address], [place] [phone] Accepts children up to age 67 who are enrolled in [place] or [place] Health Choice; pregnant women with a Medicaid card; and children who have applied for Medicaid or [place] Health Choice, but were declined, whose parents can pay a reduced fee at time of service.  [facility]  [address] [place], [place] [phone] Patients are seen by appointment only. Walk-ins are not accepted. [facility] will see patients 70 years of age and older. Monday - Tuesday (8am-5pm) Most Wednesdays (8:30-5pm) $30 per visit, cash only  [facility]  [address], [facility] [phone] Patients are seen by appointment only. Walk-ins are not accepted. [facility] will see patients 36 years of age and older. One Wednesday Evening (Monthly: Volunteer Based).  $30 per visit, cash only  [employer]  [phone] for adults; Children under age 56, call Graduate Pediatric Dentistry at [phone]. Children aged 18-14, please call [phone] to request a pediatric application.  Dental services are provided in all areas of dental care including fillings, crowns and bridges, complete and partial dentures, implants, gum treatment, root canals, and extractions. Preventive care is also provided. Treatment is provided to both adults and children. Patients are selected via a lottery and there is often a waiting list.   [facility] [address], [place]  [phone] [email or web address]   [facility] [address] [place], [place] [phone], Ext. [phone] Second and Fourth Thursday of each month, opens at  6:30 AM; Clinic ends at 9 AM.  Patients are seen on a first-come first-served basis, and a limited number are seen during each clinic.   [facility]  [address] Ether Griffins [place], [place] [phone]   Eligibility Requirements You must have lived in [place], [place], or [place] counties for at least the last three months.   You cannot be eligible for state or federal sponsored [place], including [employer], [place], or [employer].   You generally cannot be eligible for healthcare insurance through your employer.  How to apply: Eligibility screenings are held every Tuesday and Wednesday afternoon from 1:00 pm until 4:00 pm. You do not need an appointment for the interview!  [facility] [address] [place], [place] [phone]   [facility] Health Department  [phone]   [facility] Health Department  [phone]   [facility] Health Department  [phone]    Behavioral Health Resources in the Community: Intensive Outpatient Programs Organization         Address  Phone  Notes  [facility] [address] [place], [place] [phone]   [facility] Outpatient [address], [place], [place] [phone]   [facility] [address], [place], [place]  [phone]   [facility] Mental Health [address],  [place], [place] [phone] or [phone]   Substance Abuse Resources Organization         Address  Phone  Notes  [facility] Drug Services  [phone]   [facility]  [phone]   [facility] [place]  [phone]   Floydene Flock  [phone]   Residential & Outpatient Substance Abuse Program  [phone]   Psychological Services Organization         Address  Phone  Notes  [facility] Behavioral Health  [phone]   [facility]  [phone]   [facility] Mental Health [address], [place]  [phone] or [phone]    Mobile Crisis Teams Organization         Address  Phone  Notes  [facility], Mobile Crisis Care Unit  [phone]   [facility]  [address] [place], [place] [phone]   Doristine Locks [address] [place] [phone]    Self-Help/Support Groups Organization         Address  Phone             Notes  [facility] [place] - variety of support groups  [phone]- [phone] Call for more information  Narcotics Anonymous (NA), [facility] [address], [employer]  2 meetings at this location   Statistician         Address  Phone  Notes  [facility] [address],    [place]  [phone]   [facility]  [address] [place] [address], [place], [place] [phone]   [facility] [address] [place], [place] [phone] Admissions: 8am-3pm M-F  [facility] [address],    [place], [place] [phone]   [facility] [address] [place], [place], [place] [phone]   [facility] [address],  [place], [place] [phone]   [facility] - Intensive Outpatient [address], [place], [place] [phone]   [facility] (Addiction Recovery [facility] [address] [place]  [place], [place] [phone] or [phone]   [facility] ([facility]) [address], [place], [place] [phone] Accepts Medicaid  [facility] [address]  [place] [phone] Substance Abuse/Addiction Treatment   [facility] Organization         Address  Phone  Notes  [facility]  [phone]   Angie Fava, PhD [address] Knack [place], [place]   [phone] or [phone]   [facility] Behavioral   [address] [place], [place] [phone]   [facility]  [address], [place], [place] [phone] Insurance/Medicaid/sponsorship through [employer]  [facility] [address]                                    [place], [place] [phone] Therapy/tele-psych/case  [facility] [address]   [place], [place] [phone]    Dr. Lolly MustacheArfeen  [phone]) [phone]   [facility]  [facility] 1) [address], [place] 2) [address], [place] 3)  [address], [place] [phone]) [phone]  [phone]   [facility] Child Abuse Hotline [phone]) [phone] or [phone] (After Hours)

## 2014-04-28 NOTE — ED Provider Notes (Signed)
I saw and evaluated the patient, reviewed the resident's note and I agree with the findings and plan.   EKG Interpretation None      I suspect this is a contact dermatitis given the itching component of it.  Compartments are soft.  Normal radial pulse.  Normal grip strength.  No compartment syndrome.  This is not infectious appearing.  Discharge home with Benadryl and steroids  Lyanne CoKevin M Leolia Vinzant, MD 04/28/14 1510

## 2014-05-02 ENCOUNTER — Encounter (HOSPITAL_COMMUNITY): Payer: Self-pay | Admitting: Emergency Medicine

## 2014-07-22 ENCOUNTER — Ambulatory Visit: Payer: Medicaid Other | Admitting: Obstetrics & Gynecology

## 2014-07-27 ENCOUNTER — Ambulatory Visit (INDEPENDENT_AMBULATORY_CARE_PROVIDER_SITE_OTHER): Payer: Medicaid Other | Admitting: Obstetrics and Gynecology

## 2014-07-27 ENCOUNTER — Encounter: Payer: Self-pay | Admitting: Obstetrics and Gynecology

## 2014-07-27 VITALS — BP 126/87 | HR 66 | Ht 63.0 in | Wt 196.2 lb

## 2014-07-27 DIAGNOSIS — Z3046 Encounter for surveillance of implantable subdermal contraceptive: Secondary | ICD-10-CM

## 2014-07-27 DIAGNOSIS — Z308 Encounter for other contraceptive management: Secondary | ICD-10-CM

## 2014-07-27 NOTE — Patient Instructions (Signed)
Contraception Choices Contraception (birth control) is the use of any methods or devices to prevent pregnancy. Below are some methods to help avoid pregnancy. HORMONAL METHODS   Contraceptive implant. This is a thin, plastic tube containing progesterone hormone. It does not contain estrogen hormone. Your health care provider inserts the tube in the inner part of the upper arm. The tube can remain in place for up to 3 years. After 3 years, the implant must be removed. The implant prevents the ovaries from releasing an egg (ovulation), thickens the cervical mucus to prevent sperm from entering the uterus, and thins the lining of the inside of the uterus.  Progesterone-only injections. These injections are given every 3 months by your health care provider to prevent pregnancy. This synthetic progesterone hormone stops the ovaries from releasing eggs. It also thickens cervical mucus and changes the uterine lining. This makes it harder for sperm to survive in the uterus.  Birth control pills. These pills contain estrogen and progesterone hormone. They work by preventing the ovaries from releasing eggs (ovulation). They also cause the cervical mucus to thicken, preventing the sperm from entering the uterus. Birth control pills are prescribed by a health care provider.Birth control pills can also be used to treat heavy periods.  Minipill. This type of birth control pill contains only the progesterone hormone. They are taken every day of each month and must be prescribed by your health care provider.  Birth control patch. The patch contains hormones similar to those in birth control pills. It must be changed once a week and is prescribed by a health care provider.  Vaginal ring. The ring contains hormones similar to those in birth control pills. It is left in the vagina for 3 weeks, removed for 1 week, and then a new one is put back in place. The patient must be comfortable inserting and removing the ring  from the vagina.A health care provider's prescription is necessary.  Emergency contraception. Emergency contraceptives prevent pregnancy after unprotected sexual intercourse. This pill can be taken right after sex or up to 5 days after unprotected sex. It is most effective the sooner you take the pills after having sexual intercourse. Most emergency contraceptive pills are available without a prescription. Check with your pharmacist. Do not use emergency contraception as your only form of birth control. BARRIER METHODS   Female condom. This is a thin sheath (latex or rubber) that is worn over the penis during sexual intercourse. It can be used with spermicide to increase effectiveness.  Female condom. This is a soft, loose-fitting sheath that is put into the vagina before sexual intercourse.  Diaphragm. This is a soft, latex, dome-shaped barrier that must be fitted by a health care provider. It is inserted into the vagina, along with a spermicidal jelly. It is inserted before intercourse. The diaphragm should be left in the vagina for 6 to 8 hours after intercourse.  Cervical cap. This is a round, soft, latex or plastic cup that fits over the cervix and must be fitted by a health care provider. The cap can be left in place for up to 48 hours after intercourse.  Sponge. This is a soft, circular piece of polyurethane foam. The sponge has spermicide in it. It is inserted into the vagina after wetting it and before sexual intercourse.  Spermicides. These are chemicals that kill or block sperm from entering the cervix and uterus. They come in the form of creams, jellies, suppositories, foam, or tablets. They do not require a   prescription. They are inserted into the vagina with an applicator before having sexual intercourse. The process must be repeated every time you have sexual intercourse. INTRAUTERINE CONTRACEPTION  Intrauterine device (IUD). This is a T-shaped device that is put in a woman's uterus  during a menstrual period to prevent pregnancy. There are 2 types:  Copper IUD. This type of IUD is wrapped in copper wire and is placed inside the uterus. Copper makes the uterus and fallopian tubes produce a fluid that kills sperm. It can stay in place for 10 years.  Hormone IUD. This type of IUD contains the hormone progestin (synthetic progesterone). The hormone thickens the cervical mucus and prevents sperm from entering the uterus, and it also thins the uterine lining to prevent implantation of a fertilized egg. The hormone can weaken or kill the sperm that get into the uterus. It can stay in place for 3-5 years, depending on which type of IUD is used. PERMANENT METHODS OF CONTRACEPTION  Female tubal ligation. This is when the woman's fallopian tubes are surgically sealed, tied, or blocked to prevent the egg from traveling to the uterus.  Hysteroscopic sterilization. This involves placing a small coil or insert into each fallopian tube. Your doctor uses a technique called hysteroscopy to do the procedure. The device causes scar tissue to form. This results in permanent blockage of the fallopian tubes, so the sperm cannot fertilize the egg. It takes about 3 months after the procedure for the tubes to become blocked. You must use another form of birth control for these 3 months.  Female sterilization. This is when the female has the tubes that carry sperm tied off (vasectomy).This blocks sperm from entering the vagina during sexual intercourse. After the procedure, the man can still ejaculate fluid (semen). NATURAL PLANNING METHODS  Natural family planning. This is not having sexual intercourse or using a barrier method (condom, diaphragm, cervical cap) on days the woman could become pregnant.  Calendar method. This is keeping track of the length of each menstrual cycle and identifying when you are fertile.  Ovulation method. This is avoiding sexual intercourse during ovulation.  Symptothermal  method. This is avoiding sexual intercourse during ovulation, using a thermometer and ovulation symptoms.  Post-ovulation method. This is timing sexual intercourse after you have ovulated. Regardless of which type or method of contraception you choose, it is important that you use condoms to protect against the transmission of sexually transmitted infections (STIs). Talk with your health care provider about which form of contraception is most appropriate for you. Document Released: 06/17/2005 Document Revised: 06/22/2013 Document Reviewed: 12/10/2012 ExitCare Patient Information 2015 ExitCare, LLC. This information is not intended to replace advice given to you by your health care provider. Make sure you discuss any questions you have with your health care provider.  

## 2014-07-27 NOTE — Progress Notes (Signed)
29 yo here for Nexplanon removal which she has had for almost 3 years. She has experienced irregular bleeding over the past few months  Removal Patient given informed consent for removal of her Implanon, time out was performed.  Signed copy in the chart.  Appropriate time out taken. Implanon site identified.  Area prepped in usual sterile fashon. One cc of 1% lidocaine was used to anesthetize the area at the distal end of the implant. A small stab incision was made right beside the implant on the distal portion.  The implanon rod was grasped using hemostats and removed without difficulty.  There was less than 3 cc blood loss. There were no complications.  A small amount of antibiotic ointment and steri-strips were applied over the small incision.  A pressure bandage was applied to reduce any bruising.  The patient tolerated the procedure well and was given post procedure instructions.  Patient does not want any contraception at this time.

## 2015-03-13 ENCOUNTER — Encounter (HOSPITAL_COMMUNITY): Payer: Self-pay | Admitting: *Deleted

## 2015-03-13 ENCOUNTER — Inpatient Hospital Stay (HOSPITAL_COMMUNITY)
Admission: AD | Admit: 2015-03-13 | Discharge: 2015-03-13 | Disposition: A | Discharge status: Home / Self Care | Payer: Medicaid Other | Source: Ambulatory Visit | Attending: Family Medicine | Admitting: Family Medicine

## 2015-03-13 ENCOUNTER — Inpatient Hospital Stay (HOSPITAL_COMMUNITY): Payer: Medicaid Other

## 2015-03-13 DIAGNOSIS — O9989 Other specified diseases and conditions complicating pregnancy, childbirth and the puerperium: Secondary | ICD-10-CM | POA: Diagnosis not present

## 2015-03-13 DIAGNOSIS — R109 Unspecified abdominal pain: Secondary | ICD-10-CM

## 2015-03-13 DIAGNOSIS — O26899 Other specified pregnancy related conditions, unspecified trimester: Secondary | ICD-10-CM

## 2015-03-13 DIAGNOSIS — R1032 Left lower quadrant pain: Secondary | ICD-10-CM | POA: Insufficient documentation

## 2015-03-13 DIAGNOSIS — R110 Nausea: Secondary | ICD-10-CM | POA: Insufficient documentation

## 2015-03-13 DIAGNOSIS — O26891 Other specified pregnancy related conditions, first trimester: Secondary | ICD-10-CM | POA: Insufficient documentation

## 2015-03-13 DIAGNOSIS — Z3A01 Less than 8 weeks gestation of pregnancy: Secondary | ICD-10-CM | POA: Insufficient documentation

## 2015-03-13 HISTORY — DX: Unspecified abnormal cytological findings in specimens from vagina: R87.629

## 2015-03-13 LAB — URINALYSIS, ROUTINE W REFLEX MICROSCOPIC
BILIRUBIN URINE: NEGATIVE
Glucose, UA: NEGATIVE mg/dL
Ketones, ur: 40 mg/dL — AB
Leukocytes, UA: NEGATIVE
Nitrite: NEGATIVE
PROTEIN: NEGATIVE mg/dL
Specific Gravity, Urine: 1.025 (ref 1.005–1.030)
UROBILINOGEN UA: 0.2 mg/dL (ref 0.0–1.0)
pH: 6 (ref 5.0–8.0)

## 2015-03-13 LAB — URINE MICROSCOPIC-ADD ON

## 2015-03-13 LAB — CBC WITH DIFFERENTIAL/PLATELET
BASOS ABS: 0 10*3/uL (ref 0.0–0.1)
BASOS PCT: 0 % (ref 0–1)
EOS PCT: 1 % (ref 0–5)
Eosinophils Absolute: 0.1 10*3/uL (ref 0.0–0.7)
HCT: 40.7 % (ref 36.0–46.0)
Hemoglobin: 13.9 g/dL (ref 12.0–15.0)
LYMPHS PCT: 30 % (ref 12–46)
Lymphs Abs: 2 10*3/uL (ref 0.7–4.0)
MCH: 30.5 pg (ref 26.0–34.0)
MCHC: 34.2 g/dL (ref 30.0–36.0)
MCV: 89.5 fL (ref 78.0–100.0)
MONO ABS: 0.6 10*3/uL (ref 0.1–1.0)
Monocytes Relative: 9 % (ref 3–12)
Neutro Abs: 3.9 10*3/uL (ref 1.7–7.7)
Neutrophils Relative %: 60 % (ref 43–77)
PLATELETS: 242 10*3/uL (ref 150–400)
RBC: 4.55 MIL/uL (ref 3.87–5.11)
RDW: 13.8 % (ref 11.5–15.5)
WBC: 6.5 10*3/uL (ref 4.0–10.5)

## 2015-03-13 LAB — WET PREP, GENITAL
Clue Cells Wet Prep HPF POC: NONE SEEN
Trich, Wet Prep: NONE SEEN
YEAST WET PREP: NONE SEEN

## 2015-03-13 LAB — ABO/RH: ABO/RH(D): A POS

## 2015-03-13 LAB — HCG, QUANTITATIVE, PREGNANCY: hCG, Beta Chain, Quant, S: 48737 m[IU]/mL — ABNORMAL HIGH (ref <5)

## 2015-03-13 LAB — POCT PREGNANCY, URINE: PREG TEST UR: POSITIVE — AB

## 2015-03-13 NOTE — MAU Note (Signed)
PA ordered labs and [place]. Explained to pt that she will have these done while in lobby, no rooms at this time. Pt OK with the plan and said thank you

## 2015-03-13 NOTE — MAU Note (Signed)
Weak feeling x 2 weeks, nausea, and lower abd pain 7/10 x 2 weeks, no home UPT done, denies bleeding and discharge, to BR

## 2015-03-13 NOTE — MAU Provider Note (Signed)
History     CSN: [id number]  Arrival date and time: 03/13/15 1043   First Provider Initiated Contact with Patient 03/13/15 1436      Chief Complaint  Patient presents with  . Abdominal Pain  . Fatigue   HPI  [name] is a 29 y.o. G3P2002 at [redacted]w[redacted]d who presents to MAU today with complaint of LLQ abdominal pain x 2 days. She denies pain now. She has not taken anything for pain. She has had intermittent nausea without vomiting, diarrhea or constipation. She denies vaginal bleeding, discharge, UTI symptoms or fever.   OB History    Gravida Para Term Preterm AB TAB SAB Ectopic Multiple Living   3 2 2  0 0 0 0 0 0 2      Past Medical History  Diagnosis Date  . Vaginal Pap smear, abnormal     Past Surgical History  Procedure Laterality Date  . Leep  2008    paps have been normal since.    History reviewed. No pertinent family history.  Social History  Substance Use Topics  . Smoking status: Never Smoker   . Smokeless tobacco: Never Used  . Alcohol Use: No    Allergies: No Known Allergies  No prescriptions prior to admission    Review of Systems  Constitutional: Negative for fever and malaise/fatigue.  Gastrointestinal: Positive for nausea and abdominal pain. Negative for vomiting, diarrhea and constipation.  Genitourinary: Negative for dysuria, urgency and frequency.       Neg - vaginal bleeding, discharge   Physical Exam   Blood pressure 135/86, pulse 75, temperature 98.7 F (37.1 C), resp. rate 18, height 5\' 3"  (1.6 m), weight 180 lb (81.647 kg), last menstrual period 02/01/2015.  Physical Exam  Nursing note and vitals reviewed. Constitutional: She is oriented to person, place, and time. She appears well-developed and well-nourished. No distress.  HENT:  Head: Normocephalic and atraumatic.  Cardiovascular: Normal rate.   Respiratory: Effort normal.  GI: Soft. She exhibits no distension and no mass. There is tenderness (mild tendernes to palpation  of the LLQ ). There is no rebound and no guarding.  Genitourinary: Uterus is not enlarged and not tender. Right adnexum displays no tenderness. Left adnexum displays no tenderness. No bleeding in the vagina. No vaginal discharge found.  Neurological: She is alert and oriented to person, place, and time.  Skin: Skin is warm and dry. No erythema.  Psychiatric: She has a normal mood and affect.   Results for orders placed or performed during the hospital encounter of 03/13/15 (from the past 24 hour(s))  Urinalysis, Routine w reflex microscopic (not at [facility])     Status: Abnormal   Collection Time: 03/13/15 12:50 PM  Result Value Ref Range   Color, Urine YELLOW YELLOW   APPearance HAZY (A) CLEAR   Specific Gravity, Urine 1.025 1.005 - 1.030   pH 6.0 5.0 - 8.0   Glucose, UA NEGATIVE NEGATIVE mg/dL   Hgb urine dipstick SMALL (A) NEGATIVE   Bilirubin Urine NEGATIVE NEGATIVE   Ketones, ur 40 (A) NEGATIVE mg/dL   Protein, ur NEGATIVE NEGATIVE mg/dL   Urobilinogen, UA 0.2 0.0 - 1.0 mg/dL   Nitrite NEGATIVE NEGATIVE   Leukocytes, UA NEGATIVE NEGATIVE  Urine microscopic-add on     Status: Abnormal   Collection Time: 03/13/15 12:50 PM  Result Value Ref Range   Squamous Epithelial / LPF FEW (A) RARE   WBC, UA 0-2 <3 WBC/hpf   RBC / HPF 0-2 <3  RBC/hpf   Bacteria, UA RARE RARE   Urine-Other MUCOUS PRESENT   Pregnancy, urine POC     Status: Abnormal   Collection Time: 03/13/15 12:58 PM  Result Value Ref Range   Preg Test, Ur POSITIVE (A) NEGATIVE  CBC with Differential/Platelet     Status: None   Collection Time: 03/13/15  1:10 PM  Result Value Ref Range   WBC 6.5 4.0 - 10.5 K/uL   RBC 4.55 3.87 - 5.11 MIL/uL   Hemoglobin 13.9 12.0 - 15.0 g/dL   HCT 81.1 91.4 - 78.2 %   MCV 89.5 78.0 - 100.0 fL   MCH 30.5 26.0 - 34.0 pg   MCHC 34.2 30.0 - 36.0 g/dL   RDW 95.6 21.3 - 08.6 %   Platelets 242 150 - 400 K/uL   Neutrophils Relative % 60 43 - 77 %   Neutro Abs 3.9 1.7 - 7.7 K/uL   Lymphocytes  Relative 30 12 - 46 %   Lymphs Abs 2.0 0.7 - 4.0 K/uL   Monocytes Relative 9 3 - 12 %   Monocytes Absolute 0.6 0.1 - 1.0 K/uL   Eosinophils Relative 1 0 - 5 %   Eosinophils Absolute 0.1 0.0 - 0.7 K/uL   Basophils Relative 0 0 - 1 %   Basophils Absolute 0.0 0.0 - 0.1 K/uL  ABO/Rh     Status: None   Collection Time: 03/13/15  1:10 PM  Result Value Ref Range   ABO/RH(D) A POS   hCG, quantitative, pregnancy     Status: Abnormal   Collection Time: 03/13/15  1:10 PM  Result Value Ref Range   hCG, Beta Chain, Quant, S 57846 (H) <5 mIU/mL  Wet prep, genital     Status: Abnormal   Collection Time: 03/13/15  2:45 PM  Result Value Ref Range   Yeast Wet Prep HPF POC NONE SEEN NONE SEEN   Trich, Wet Prep NONE SEEN NONE SEEN   Clue Cells Wet Prep HPF POC NONE SEEN NONE SEEN   WBC, Wet Prep HPF POC FEW (A) NONE SEEN   US Ob Comp Less 14 Wks  03/13/2015   CLINICAL DATA:  Pelvic pain for 2 weeks.  Nausea.  EXAM: OBSTETRIC <14 WK [place] AND TRANSVAGINAL OB US  TECHNIQUE: Both transabdominal and transvaginal ultrasound examinations were performed for complete evaluation of the gestation as well as the maternal uterus, adnexal regions, and pelvic cul-de-sac. Transvaginal technique was performed to assess early pregnancy.  COMPARISON:  None.  FINDINGS: Intrauterine gestational sac: Visualized/normal in shape.  Yolk sac:  Visualized  Embryo:  Visualized  Cardiac Activity: Visualized  Heart Rate: 120  bpm  CRL:  5  mm   6 w   1 d                  [place] EDC: Nov 05, 2015  Maternal uterus/adnexae: There is a 6 x 5 mm subchorionic hemorrhage. Cervical os is closed. There is a 3.0 x 2.5 x 2.1 cm presumed corpus luteum arising from left ovary. No other maternal extrauterine pelvic masses are identified. There is a small amount of maternal free pelvic fluid.  IMPRESSION: Single live intrauterine gestation with estimated gestational age of [redacted] weeks. Small subchorionic hemorrhage. 3 cm probable corpus luteum left ovary. Small  amount of free pelvic fluid may represent recent ovarian cyst rupture or leakage.   Electronically Signed   By: Bretta Bang III M.D.   On: 03/13/2015 14:32   US Ob  Transvaginal  03/13/2015   CLINICAL DATA:  Pelvic pain for 2 weeks.  Nausea.  EXAM: OBSTETRIC <14 WK [place] AND TRANSVAGINAL OB US  TECHNIQUE: Both transabdominal and transvaginal ultrasound examinations were performed for complete evaluation of the gestation as well as the maternal uterus, adnexal regions, and pelvic cul-de-sac. Transvaginal technique was performed to assess early pregnancy.  COMPARISON:  None.  FINDINGS: Intrauterine gestational sac: Visualized/normal in shape.  Yolk sac:  Visualized  Embryo:  Visualized  Cardiac Activity: Visualized  Heart Rate: 120  bpm  CRL:  5  mm   6 w   1 d                  [place] EDC: Nov 05, 2015  Maternal uterus/adnexae: There is a 6 x 5 mm subchorionic hemorrhage. Cervical os is closed. There is a 3.0 x 2.5 x 2.1 cm presumed corpus luteum arising from left ovary. No other maternal extrauterine pelvic masses are identified. There is a small amount of maternal free pelvic fluid.  IMPRESSION: Single live intrauterine gestation with estimated gestational age of [redacted] weeks. Small subchorionic hemorrhage. 3 cm probable corpus luteum left ovary. Small amount of free pelvic fluid may represent recent ovarian cyst rupture or leakage.   Electronically Signed   By: Bretta Bang III M.D.   On: 03/13/2015 14:32    MAU Course  Procedures None  MDM +UPT UA, wet prep, GC/chlamydia, CBC, ABO/Rh, quant hCG, HIV, RPR and [place] today to rule out ectopic pregnancy  Assessment and Plan  A: SIUP at [redacted]w[redacted]d by LMP with normal cardiac activity on [place] today Abdominal pain in pregnancy Nausea without vomiting  P: Discharge home First trimester precautions discussed Pregnancy confirmation letter given Patient advised to follow-up with [facility] as planned to start prenatal care Patient may return to MAU as needed or if her  condition were to change or worsen  Marny Lowenstein, PA-C  03/13/2015, 3:54 PM

## 2015-03-13 NOTE — Progress Notes (Signed)
Patient had labs drawn and went to U/S from lobby.

## 2015-03-13 NOTE — Discharge Instructions (Signed)
First Trimester of Pregnancy The first trimester of pregnancy is from week 1 until the end of week 12 (months 1 through 3). During this time, your baby will begin to develop inside you. At 6-8 weeks, the eyes and face are formed, and the heartbeat can be seen on ultrasound. At the end of 12 weeks, all the baby's organs are formed. Prenatal care is all the medical care you receive before the birth of your baby. Make sure you get good prenatal care and follow all of your doctor's instructions. HOME CARE  Medicines  Take medicine only as told by your doctor. Some medicines are safe and some are not during pregnancy.  Take your prenatal vitamins as told by your doctor.  Take medicine that helps you poop (stool softener) as needed if your doctor says it is okay. Diet  Eat regular, healthy meals.  Your doctor will tell you the amount of weight gain that is right for you.  Avoid raw meat and uncooked cheese.  If you feel sick to your stomach (nauseous) or throw up (vomit):  Eat 4 or 5 small meals a day instead of 3 large meals.  Try eating a few soda crackers.  Drink liquids between meals instead of during meals.  If you have a hard time pooping (constipation):  Eat high-fiber foods like fresh vegetables, fruit, and whole grains.  Drink enough fluids to keep your pee (urine) clear or pale yellow. Activity and Exercise  Exercise only as told by your doctor. Stop exercising if you have cramps or pain in your lower belly (abdomen) or low back.  Try to avoid standing for long periods of time. Move your legs often if you must stand in one place for a long time.  Avoid heavy lifting.  Wear low-heeled shoes. Sit and stand up straight.  You can have sex unless your doctor tells you not to. Relief of Pain or Discomfort  Wear a good support bra if your breasts are sore.  Take warm water baths (sitz baths) to soothe pain or discomfort caused by hemorrhoids. Use hemorrhoid cream if your  doctor says it is okay.  Rest with your legs raised if you have leg cramps or low back pain.  Wear support hose if you have puffy, bulging veins (varicose veins) in your legs. Raise (elevate) your feet for 15 minutes, 3-4 times a day. Limit salt in your diet. Prenatal Care  Schedule your prenatal visits by the twelfth week of pregnancy.  Write down your questions. Take them to your prenatal visits.  Keep all your prenatal visits as told by your doctor. Safety  Wear your seat belt at all times when driving.  Make a list of emergency phone numbers. The list should include numbers for family, friends, the hospital, and police and fire departments. General Tips  Ask your doctor for a referral to a local prenatal class. Begin classes no later than at the start of month 6 of your pregnancy.  Ask for help if you need counseling or help with nutrition. Your doctor can give you advice or tell you where to go for help.  Do not use hot tubs, steam rooms, or saunas.  Do not douche or use tampons or scented sanitary pads.  Do not cross your legs for long periods of time.  Avoid litter boxes and soil used by cats.  Avoid all smoking, herbs, and alcohol. Avoid drugs not approved by your doctor.  Visit your dentist. At home, brush your teeth   with a soft toothbrush. Be gentle when you floss. GET HELP IF:  You are dizzy.  You have mild cramps or pressure in your lower belly.  You have a nagging pain in your belly area.  You continue to feel sick to your stomach, throw up, or have watery poop (diarrhea).  You have a bad smelling fluid coming from your vagina.  You have pain with peeing (urination).  You have increased puffiness (swelling) in your face, hands, legs, or ankles. GET HELP RIGHT AWAY IF:   You have a fever.  You are leaking fluid from your vagina.  You have spotting or bleeding from your vagina.  You have very bad belly cramping or pain.  You gain or lose weight  rapidly.  You throw up blood. It may look like coffee grounds.  You are around people who have German measles, fifth disease, or chickenpox.  You have a very bad headache.  You have shortness of breath.  You have any kind of trauma, such as from a fall or a car accident. Document Released: 12/04/2007 Document Revised: 11/01/2013 Document Reviewed: 04/27/2013 Exit[employer] Patient Information 2015 [employer]. This information is not intended to replace advice given to you by your health care provider. Make sure you discuss any questions you have with your health care provider.  

## 2015-03-14 LAB — GC/CHLAMYDIA PROBE AMP (~~LOC~~) NOT AT ARMC
Chlamydia: NEGATIVE
NEISSERIA GONORRHEA: NEGATIVE

## 2015-03-14 LAB — RPR: RPR: NONREACTIVE

## 2015-03-14 LAB — HIV ANTIBODY (ROUTINE TESTING W REFLEX): HIV SCREEN 4TH GENERATION: NONREACTIVE

## 2015-04-13 ENCOUNTER — Other Ambulatory Visit (HOSPITAL_COMMUNITY): Payer: Self-pay | Admitting: Urology

## 2015-04-13 DIAGNOSIS — Z3682 Encounter for antenatal screening for nuchal translucency: Secondary | ICD-10-CM

## 2015-04-13 DIAGNOSIS — Z3A12 12 weeks gestation of pregnancy: Secondary | ICD-10-CM

## 2015-04-13 LAB — OB RESULTS CONSOLE GC/CHLAMYDIA
CHLAMYDIA, DNA PROBE: NEGATIVE
GC PROBE AMP, GENITAL: NEGATIVE

## 2015-04-13 LAB — OB RESULTS CONSOLE RPR: RPR: NONREACTIVE

## 2015-04-13 LAB — HEMOGLOBIN EVAL RFX ELECTROPHORESIS
CYSTIC FIBROSIS PROFILE: NEGATIVE
Hemoglobin Evaluation: NORMAL
Pap: NEGATIVE

## 2015-04-13 LAB — OB RESULTS CONSOLE ANTIBODY SCREEN: ANTIBODY SCREEN: NEGATIVE

## 2015-04-13 LAB — OB RESULTS CONSOLE ABO/RH: RH Type: POSITIVE

## 2015-04-13 LAB — OB RESULTS CONSOLE HIV ANTIBODY (ROUTINE TESTING): HIV: NONREACTIVE

## 2015-04-13 LAB — OB RESULTS CONSOLE RUBELLA ANTIBODY, IGM: RUBELLA: NON-IMMUNE/NOT IMMUNE

## 2015-04-13 LAB — OB RESULTS CONSOLE HEPATITIS B SURFACE ANTIGEN: Hepatitis B Surface Ag: NEGATIVE

## 2015-04-27 ENCOUNTER — Ambulatory Visit (HOSPITAL_COMMUNITY)
Admission: RE | Admit: 2015-04-27 | Discharge: 2015-04-27 | Disposition: A | Discharge status: Home / Self Care | Payer: Medicaid Other | Source: Ambulatory Visit | Attending: Physician Assistant | Admitting: Physician Assistant

## 2015-04-27 ENCOUNTER — Encounter (HOSPITAL_COMMUNITY): Payer: Self-pay

## 2015-04-27 VITALS — BP 138/85 | HR 79 | Wt 192.4 lb

## 2015-04-27 DIAGNOSIS — Z36 Encounter for antenatal screening of mother: Secondary | ICD-10-CM | POA: Insufficient documentation

## 2015-04-27 DIAGNOSIS — Z3689 Encounter for other specified antenatal screening: Secondary | ICD-10-CM

## 2015-04-27 DIAGNOSIS — Z3682 Encounter for antenatal screening for nuchal translucency: Secondary | ICD-10-CM

## 2015-04-27 DIAGNOSIS — Z3A12 12 weeks gestation of pregnancy: Secondary | ICD-10-CM

## 2015-06-01 ENCOUNTER — Other Ambulatory Visit (HOSPITAL_COMMUNITY): Payer: Self-pay

## 2015-06-08 ENCOUNTER — Ambulatory Visit (HOSPITAL_COMMUNITY)
Admission: RE | Admit: 2015-06-08 | Discharge: 2015-06-08 | Disposition: A | Discharge status: Home / Self Care | Payer: Medicaid Other | Source: Ambulatory Visit | Attending: Physician Assistant | Admitting: Physician Assistant

## 2015-06-08 DIAGNOSIS — Z3689 Encounter for other specified antenatal screening: Secondary | ICD-10-CM

## 2015-06-08 DIAGNOSIS — Z3A18 18 weeks gestation of pregnancy: Secondary | ICD-10-CM | POA: Diagnosis not present

## 2015-06-08 DIAGNOSIS — Z36 Encounter for antenatal screening of mother: Secondary | ICD-10-CM | POA: Insufficient documentation

## 2015-06-14 ENCOUNTER — Encounter (HOSPITAL_COMMUNITY): Payer: Self-pay

## 2015-06-14 ENCOUNTER — Inpatient Hospital Stay (HOSPITAL_COMMUNITY)
Admission: AD | Admit: 2015-06-14 | Discharge: 2015-06-14 | Disposition: A | Discharge status: Home / Self Care | Payer: Medicaid Other | Source: Ambulatory Visit | Attending: Obstetrics and Gynecology | Admitting: Obstetrics and Gynecology

## 2015-06-14 DIAGNOSIS — N949 Unspecified condition associated with female genital organs and menstrual cycle: Secondary | ICD-10-CM

## 2015-06-14 DIAGNOSIS — O26892 Other specified pregnancy related conditions, second trimester: Secondary | ICD-10-CM | POA: Insufficient documentation

## 2015-06-14 DIAGNOSIS — Z3A19 19 weeks gestation of pregnancy: Secondary | ICD-10-CM | POA: Diagnosis not present

## 2015-06-14 DIAGNOSIS — R1030 Lower abdominal pain, unspecified: Secondary | ICD-10-CM | POA: Diagnosis present

## 2015-06-14 LAB — URINALYSIS, ROUTINE W REFLEX MICROSCOPIC
Bilirubin Urine: NEGATIVE
Glucose, UA: NEGATIVE mg/dL
KETONES UR: NEGATIVE mg/dL
LEUKOCYTES UA: NEGATIVE
NITRITE: NEGATIVE
PROTEIN: NEGATIVE mg/dL
Specific Gravity, Urine: 1.02 (ref 1.005–1.030)
pH: 7 (ref 5.0–8.0)

## 2015-06-14 LAB — URINE MICROSCOPIC-ADD ON: WBC UA: NONE SEEN WBC/hpf (ref 0–5)

## 2015-06-14 LAB — WET PREP, GENITAL
Clue Cells Wet Prep HPF POC: NONE SEEN
SPERM: NONE SEEN
Trich, Wet Prep: NONE SEEN
Yeast Wet Prep HPF POC: NONE SEEN

## 2015-06-14 NOTE — MAU Provider Note (Signed)
History     CSN: [id number]  Arrival date and time: 06/14/15 2014   First Provider Initiated Contact with Patient 06/14/15 2156      Chief Complaint  Patient presents with  . Abdominal Pain   HPI Comments: Laura Downs is a 29 y.o. G3P2002 at [redacted]w[redacted]d who presents today with bilateral lower abdominal pain. She states that the pain is intermittent, but she can't really time an interval. She denies any VB or LOF. She confirms fetal movement.   Abdominal Pain This is a new problem. The current episode started today. The onset quality is gradual. The problem occurs intermittently (unable to time the interval ). The problem has been unchanged. The pain is located in the LLQ and RLQ. The pain is at a severity of 7/10. The quality of the pain is cramping. The abdominal pain radiates to the back. Associated symptoms include nausea. Pertinent negatives include no constipation, diarrhea, dysuria, fever, frequency or vomiting. Nothing aggravates the pain. The pain is relieved by nothing. She has tried nothing for the symptoms.    Past Medical History  Diagnosis Date  . Vaginal Pap smear, abnormal     Past Surgical History  Procedure Laterality Date  . Leep  2008    paps have been normal since.    History reviewed. No pertinent family history.  Social History  Substance Use Topics  . Smoking status: Never Smoker   . Smokeless tobacco: Never Used  . Alcohol Use: No    Allergies: No Known Allergies  No prescriptions prior to admission    Review of Systems  Constitutional: Negative for fever and chills.  Gastrointestinal: Positive for nausea and abdominal pain. Negative for vomiting, diarrhea and constipation.  Genitourinary: Negative for dysuria, urgency and frequency.   Physical Exam   Blood pressure 125/83, pulse 80, temperature 97.4 F (36.3 C), temperature source Oral, resp. rate 18, height 5' 4.2" (1.631 m), weight 89.721 kg (197 lb 12.8 oz), last menstrual period  02/01/2015, SpO2 95 %.  Physical Exam  Nursing note and vitals reviewed. Constitutional: She is oriented to person, place, and time. She appears well-developed and well-nourished. No distress.  HENT:  Head: Normocephalic.  Cardiovascular: Normal rate.   Respiratory: Effort normal.  GI: Soft. There is no tenderness.  Neurological: She is alert and oriented to person, place, and time.  Skin: Skin is warm and dry.  Psychiatric: She has a normal mood and affect.   Results for orders placed or performed during the hospital encounter of 06/14/15 (from the past 24 hour(s))  Urinalysis, Routine w reflex microscopic (not at [facility])     Status: Abnormal   Collection Time: 06/14/15  8:28 PM  Result Value Ref Range   Color, Urine YELLOW YELLOW   APPearance CLEAR CLEAR   Specific Gravity, Urine 1.020 1.005 - 1.030   pH 7.0 5.0 - 8.0   Glucose, UA NEGATIVE NEGATIVE mg/dL   Hgb urine dipstick SMALL (A) NEGATIVE   Bilirubin Urine NEGATIVE NEGATIVE   Ketones, ur NEGATIVE NEGATIVE mg/dL   Protein, ur NEGATIVE NEGATIVE mg/dL   Nitrite NEGATIVE NEGATIVE   Leukocytes, UA NEGATIVE NEGATIVE  Urine microscopic-add on     Status: Abnormal   Collection Time: 06/14/15  8:28 PM  Result Value Ref Range   Squamous Epithelial / LPF 6-30 (A) NONE SEEN   WBC, UA NONE SEEN 0 - 5 WBC/hpf   RBC / HPF 0-5 0 - 5 RBC/hpf   Bacteria, UA FEW (A) NONE SEEN  Urine-Other MUCOUS PRESENT   Wet prep, genital     Status: Abnormal   Collection Time: 06/14/15 10:00 PM  Result Value Ref Range   Yeast Wet Prep HPF POC NONE SEEN NONE SEEN   Trich, Wet Prep NONE SEEN NONE SEEN   Clue Cells Wet Prep HPF POC NONE SEEN NONE SEEN   WBC, Wet Prep HPF POC FEW (A) NONE SEEN   Sperm NONE SEEN     MAU Course  Procedures  MDM   Assessment and Plan   1. Round ligament pain    DC home Comfort measures reviewed  2nd Trimester precautions  PTL precautions  RX: none  Return to MAU as needed FU with OB as  planned  Follow-up Information    [facility]   Contact information:   [address] [place] [id number] [phone]         Tawnya CrookHogan, Heather Donovan 06/14/2015, 9:58 PM

## 2015-06-14 NOTE — MAU Note (Signed)
Pt c/o pain that feels like contractions that started earlier today. States that feels like they are every 5 mins. Rates 7/10. States that she did not take any medications but tried increasing water intake but that did not help. Denies vaginal bleeding or discharge.

## 2015-06-14 NOTE — Discharge Instructions (Signed)

## 2015-06-15 LAB — GC/CHLAMYDIA PROBE AMP (~~LOC~~) NOT AT ARMC
Chlamydia: NEGATIVE
Neisseria Gonorrhea: NEGATIVE

## 2015-06-17 LAB — URINE CULTURE

## 2015-07-02 NOTE — L&D Delivery Note (Signed)
Delivery Note At 4:08 AM a viable female was delivered via Vaginal, Spontaneous Delivery (Presentation: Left Occiput anterior ).  APGAR:8 ,9 ; weight  .   Placenta status: Intact, Spontaneous.  Cord: 3 vessels with the following complications: true  Knot.  Cord pH: n/a  Anesthesia: None  Episiotomy:   Lacerations: None Suture Repair: n/a Est. Blood Loss (mL): 50  Mom to postpartum.  Baby to Couplet care / Skin to Skin.  Laura Downs 11/06/2015, 4:31 AM

## 2015-09-07 ENCOUNTER — Inpatient Hospital Stay (HOSPITAL_COMMUNITY)
Admission: AD | Admit: 2015-09-07 | Discharge: 2015-09-07 | Disposition: A | Discharge status: Home / Self Care | Payer: Medicaid Other | Source: Ambulatory Visit | Attending: Obstetrics & Gynecology | Admitting: Obstetrics & Gynecology

## 2015-09-07 ENCOUNTER — Encounter (HOSPITAL_COMMUNITY): Payer: Self-pay | Admitting: *Deleted

## 2015-09-07 DIAGNOSIS — O9989 Other specified diseases and conditions complicating pregnancy, childbirth and the puerperium: Secondary | ICD-10-CM

## 2015-09-07 DIAGNOSIS — R51 Headache: Secondary | ICD-10-CM | POA: Diagnosis not present

## 2015-09-07 DIAGNOSIS — O26899 Other specified pregnancy related conditions, unspecified trimester: Secondary | ICD-10-CM

## 2015-09-07 DIAGNOSIS — O26893 Other specified pregnancy related conditions, third trimester: Secondary | ICD-10-CM

## 2015-09-07 DIAGNOSIS — Z3A31 31 weeks gestation of pregnancy: Secondary | ICD-10-CM | POA: Insufficient documentation

## 2015-09-07 DIAGNOSIS — R109 Unspecified abdominal pain: Secondary | ICD-10-CM | POA: Insufficient documentation

## 2015-09-07 LAB — URINALYSIS, ROUTINE W REFLEX MICROSCOPIC
Bilirubin Urine: NEGATIVE
GLUCOSE, UA: NEGATIVE mg/dL
Ketones, ur: NEGATIVE mg/dL
LEUKOCYTES UA: NEGATIVE
Nitrite: NEGATIVE
PH: 6 (ref 5.0–8.0)
PROTEIN: NEGATIVE mg/dL
SPECIFIC GRAVITY, URINE: 1.01 (ref 1.005–1.030)

## 2015-09-07 LAB — URINE MICROSCOPIC-ADD ON: BACTERIA UA: NONE SEEN

## 2015-09-07 MED ORDER — BUTALBITAL-APAP-CAFFEINE 50-325-40 MG PO TABS
1.0000 | ORAL_TABLET | Freq: Once | ORAL | Status: AC
Start: 2015-09-07 — End: 2015-09-07
  Administered 2015-09-07: 1 via ORAL
  Filled 2015-09-07: qty 1

## 2015-09-07 MED ORDER — ACETAMINOPHEN 325 MG PO TABS: 650.0000 mg | ORAL_TABLET | Freq: Once | ORAL | Status: DC

## 2015-09-07 MED ORDER — BUTALBITAL-APAP-CAFFEINE 50-325-40 MG PO TABS: 1.0000 | ORAL_TABLET | Freq: Four times a day (QID) | ORAL | Status: AC | PRN

## 2015-09-07 NOTE — MAU Note (Addendum)
Pt reports she has been having feeling of baby "balling up" and increased pressure.pt denies vag bleeding or discharge.Pt also c/o a headache for the past 2 days. Taking tylenol with some relief.

## 2015-09-07 NOTE — MAU Provider Note (Signed)
History     CSN: [id number]  Arrival date and time: 09/07/15 1645   None     Chief Complaint  Patient presents with  . Contractions  . Headache   HPI Pt is 5413w1d G3P2002 who presents with abd pain vs baby "balling up.  Pt denies vaginal discharge, bleeding or LOF.  Pt also c/o of right sided headache for 2 days.  She has taken some tylenol with some relief and then h/a returns.  Pt denies photosensitivity or vision changes. RN note:  Original Note by Madaline SavageKathrine M Wilson, RN (Registered Nurse) filed at 09/07/2015 5:08 PM   Expand All Collapse All   Pt reports she has been having feeling of baby "balling up" and increased pressure.pt denies vag bleeding or discharge.Pt also c/o a headache for the past 2 days. Taking tylenol with some relief       Past Medical History  Diagnosis Date  . Vaginal Pap smear, abnormal     Past Surgical History  Procedure Laterality Date  . Leep  2008    paps have been normal since.    History reviewed. No pertinent family history.  Social History  Substance Use Topics  . Smoking status: Never Smoker   . Smokeless tobacco: Never Used  . Alcohol Use: No    Allergies: No Known Allergies  No prescriptions prior to admission    Review of Systems  Constitutional: Negative for fever and chills.  Eyes: Negative for blurred vision, double vision and photophobia.  Gastrointestinal: Positive for abdominal pain. Negative for nausea, vomiting, diarrhea and constipation.  Genitourinary: Negative for dysuria.  Neurological: Positive for headaches. Negative for dizziness.   Physical Exam   Blood pressure 131/79, pulse 88, temperature 98.1 F (36.7 C), temperature source Oral, resp. rate 18, height 5' 4.25" (1.632 m), weight 215 lb 6.4 oz (97.705 kg), last menstrual period 02/01/2015.  Physical Exam  Vitals reviewed. Constitutional: She is oriented to person, place, and time. She appears well-developed and well-nourished. No distress.  HENT:   Head: Normocephalic.  Eyes: Pupils are equal, round, and reactive to light.  Neck: Normal range of motion. Neck supple.  Cardiovascular: Normal rate.   Respiratory: Effort normal.  GI: Soft. She exhibits no distension. There is no tenderness. There is no rebound and no guarding.  Pt did not experience any abdominal pain while in MAU; pt felt some tightening up on right side of abdomen when baby was moving. No ctx noted on monitor    Genitourinary:  Cervix parous, closed, thick, posterior  Musculoskeletal: Normal range of motion.  Neurological: She is alert and oriented to person, place, and time.  Skin: Skin is warm and dry.  Psychiatric: She has a normal mood and affect.    MAU Course  Procedures Filed Vitals:   09/07/15 1710 09/07/15 1839  BP: 131/79 124/80  Pulse: 88 90  Temp: 98.1 F (36.7 C)   Resp: 18    Results for orders placed or performed during the hospital encounter of 09/07/15 (from the past 24 hour(s))  Urinalysis, Routine w reflex microscopic (not [facility])     Status: Abnormal   Collection Time: 09/07/15  5:12 PM  Result Value Ref Range   Color, Urine YELLOW YELLOW   APPearance CLEAR CLEAR   Specific Gravity, Urine 1.010 1.005 - 1.030   pH 6.0 5.0 - 8.0   Glucose, UA NEGATIVE NEGATIVE mg/dL   Hgb urine dipstick SMALL (A) NEGATIVE   Bilirubin Urine NEGATIVE NEGATIVE  Ketones, ur NEGATIVE NEGATIVE mg/dL   Protein, ur NEGATIVE NEGATIVE mg/dL   Nitrite NEGATIVE NEGATIVE   Leukocytes, UA NEGATIVE NEGATIVE  Urine microscopic-add on     Status: Abnormal   Collection Time: 09/07/15  5:12 PM  Result Value Ref Range   Squamous Epithelial / LPF 0-5 (A) NONE SEEN   WBC, UA 0-5 0 - 5 WBC/hpf   RBC / HPF 0-5 0 - 5 RBC/hpf   Bacteria, UA NONE SEEN NONE SEEN  Fioricet given for headache with relief No abdominal pain while in MAU  Assessment and Plan  Abdominal pain in pregnancy Reactive NST Headache in pregnancy- Rx Fioricet F/u with OB appointment; sooner if  any increase in pain or concerns, LOF Kick counts   LINEBERRY,SUSAN 09/07/2015, 5:36 PM

## 2015-10-02 ENCOUNTER — Other Ambulatory Visit (HOSPITAL_COMMUNITY): Payer: Self-pay | Admitting: Physician Assistant

## 2015-10-02 DIAGNOSIS — Z3689 Encounter for other specified antenatal screening: Secondary | ICD-10-CM

## 2015-10-04 ENCOUNTER — Ambulatory Visit (HOSPITAL_COMMUNITY)
Admission: RE | Admit: 2015-10-04 | Discharge: 2015-10-04 | Disposition: A | Discharge status: Home / Self Care | Payer: Medicaid Other | Source: Ambulatory Visit | Attending: Physician Assistant | Admitting: Physician Assistant

## 2015-10-04 ENCOUNTER — Other Ambulatory Visit (HOSPITAL_COMMUNITY): Payer: Self-pay | Admitting: Physician Assistant

## 2015-10-04 DIAGNOSIS — Z3A35 35 weeks gestation of pregnancy: Secondary | ICD-10-CM | POA: Insufficient documentation

## 2015-10-04 DIAGNOSIS — Z369 Encounter for antenatal screening, unspecified: Secondary | ICD-10-CM

## 2015-10-04 DIAGNOSIS — Z3689 Encounter for other specified antenatal screening: Secondary | ICD-10-CM

## 2015-10-04 DIAGNOSIS — Z36 Encounter for antenatal screening of mother: Secondary | ICD-10-CM | POA: Diagnosis not present

## 2015-10-04 DIAGNOSIS — O99213 Obesity complicating pregnancy, third trimester: Secondary | ICD-10-CM | POA: Insufficient documentation

## 2015-10-18 LAB — OB RESULTS CONSOLE GBS: GBS: NEGATIVE

## 2015-11-02 ENCOUNTER — Inpatient Hospital Stay (HOSPITAL_COMMUNITY)
Admission: AD | Admit: 2015-11-02 | Discharge: 2015-11-02 | Disposition: A | Discharge status: Home / Self Care | Payer: Medicaid Other | Source: Ambulatory Visit | Attending: Obstetrics & Gynecology | Admitting: Obstetrics & Gynecology

## 2015-11-02 ENCOUNTER — Encounter (HOSPITAL_COMMUNITY): Payer: Self-pay | Admitting: *Deleted

## 2015-11-02 DIAGNOSIS — M549 Dorsalgia, unspecified: Secondary | ICD-10-CM | POA: Insufficient documentation

## 2015-11-02 DIAGNOSIS — R51 Headache: Secondary | ICD-10-CM | POA: Diagnosis not present

## 2015-11-02 DIAGNOSIS — Z3A39 39 weeks gestation of pregnancy: Secondary | ICD-10-CM | POA: Diagnosis not present

## 2015-11-02 DIAGNOSIS — O471 False labor at or after 37 completed weeks of gestation: Secondary | ICD-10-CM | POA: Diagnosis not present

## 2015-11-02 MED ORDER — HYDROXYZINE PAMOATE 50 MG PO CAPS: 50.0000 mg | ORAL_CAPSULE | Freq: Four times a day (QID) | ORAL | Status: DC | PRN

## 2015-11-02 NOTE — MAU Note (Signed)
Urine sent to lab 

## 2015-11-02 NOTE — MAU Provider Note (Signed)
  History     CSN: [id number]  Arrival date and time: 11/02/15 [id number]   First Provider Initiated Contact with Patient 11/02/15 2026      Chief Complaint  Patient presents with  . Back Pain   HPI Ms. Barton Duboisurvis is a 30yo [id number] at 39+1 who presented to MAU for labor check.  She has had contractions since early this afternoon.  She denies VB, LOF, or decreased FM.    [facility] and had an elevated BP and protein in a urine dipstick.  She was scheduled to return tomorrow for BP recheck.  She has had a headache today, which resolved with Tylenol.  Past Medical History  Diagnosis Date  . Vaginal Pap smear, abnormal     Past Surgical History  Procedure Laterality Date  . Leep  2008    paps have been normal since.    No family history on file.  Social History  Substance Use Topics  . Smoking status: Never Smoker   . Smokeless tobacco: Never Used  . Alcohol Use: No    Allergies: No Known Allergies  Prescriptions prior to admission  Medication Sig Dispense Refill Last Dose  . acetaminophen (TYLENOL) 500 MG tablet Take 500 mg by mouth every 6 (six) hours as needed for headache.   11/02/2015 at Unknown time  . butalbital-acetaminophen-caffeine (FIORICET) 50-325-40 MG tablet Take 1-2 tablets by mouth every 6 (six) hours as needed for headache. 20 tablet 0 Past Month at Unknown time  . Prenatal Vit-Min-FA-Fish Oil ([employer] PRENATAL GUMMY) 0.4-113.5 MG CHEW Chew 2 each by mouth daily.   Past Month at Unknown time    ROS  Per HPI Physical Exam   Blood pressure 127/83, pulse 102, temperature 97.7 F (36.5 C), temperature source Oral, resp. rate 20, height 5\' 4"  (1.626 m), weight 236 lb 4 oz (107.162 kg), last menstrual period 02/01/2015.  Physical Exam Gen: alert, in NAD, occasionally grimacing with contractions Chest: normal WOB Ext: trace pedal edema bilaterally Skin: no rashes or lesions noted GU: SVE 3/60/-3, unchanged on recheck  FHTs: reactive  NST MAU Course  Procedures  MDM Patient here for labor check.  SVE unchanged after 1.5 hours in MAU.  Reactive NST.  BPs wnl here.  Assessment and Plan  30yo G3P2002 at 39+1 here for labor check.  -- Not in labor.  Discharge home with Vistaril for sleep tonight and strict labor precautions.   -- [facility].  Caesar ChestnutKelly E Garcia 11/02/2015, 9:53 PM    CNM attestation:  I have seen and examined this patient; I agree with above documentation in the resident's note.  ROS, labs, PMH reviewed NST reactive   Tawnya CrookHogan, Heather Donovan, CNM 10:21 PM

## 2015-11-02 NOTE — Discharge Instructions (Signed)
Braxton Hicks Contractions °Contractions of the uterus can occur throughout pregnancy. Contractions are not always a sign that you are in labor.  °WHAT ARE BRAXTON HICKS CONTRACTIONS?  °Contractions that occur before labor are called Braxton Hicks contractions, or false labor. Toward the end of pregnancy (32-34 weeks), these contractions can develop more often and may become more forceful. This is not true labor because these contractions do not result in opening (dilatation) and thinning of the cervix. They are sometimes difficult to tell apart from true labor because these contractions can be forceful and people have different pain tolerances. You should not feel embarrassed if you go to the hospital with false labor. Sometimes, the only way to tell if you are in true labor is for your health care provider to look for changes in the cervix. °If there are no prenatal problems or other health problems associated with the pregnancy, it is completely safe to be sent home with false labor and await the onset of true labor. °HOW CAN YOU TELL THE DIFFERENCE BETWEEN TRUE AND FALSE LABOR? °False Labor °· The contractions of false labor are usually shorter and not as hard as those of true labor.   °· The contractions are usually irregular.   °· The contractions are often felt in the front of the lower abdomen and in the groin.   °· The contractions may go away when you walk around or change positions while lying down.   °· The contractions get weaker and are shorter lasting as time goes on.   °· The contractions do not usually become progressively stronger, regular, and closer together as with true labor.   °True Labor °· Contractions in true labor last 30-70 seconds, become very regular, usually become more intense, and increase in frequency.   °· The contractions do not go away with walking.   °· The discomfort is usually felt in the top of the uterus and spreads to the lower abdomen and low back.   °· True labor can be  determined by your health care provider with an exam. This will show that the cervix is dilating and getting thinner.   °WHAT TO REMEMBER °· Keep up with your usual exercises and follow other instructions given by your health care provider.   °· Take medicines as directed by your health care provider.   °· Keep your regular prenatal appointments.   °· Eat and drink lightly if you think you are going into labor.   °· If Braxton Hicks contractions are making you uncomfortable:   °¨ Change your position from lying down or resting to walking, or from walking to resting.   °¨ Sit and rest in a tub of warm water.   °¨ Drink 2-3 glasses of water. Dehydration may cause these contractions.   °¨ Do slow and deep breathing several times an hour.   °WHEN SHOULD I SEEK IMMEDIATE MEDICAL CARE? °Seek immediate medical care if: °· Your contractions become stronger, more regular, and closer together.   °· You have fluid leaking or gushing from your vagina.   °· You have a fever.   °· You pass blood-tinged mucus.   °· You have vaginal bleeding.   °· You have continuous abdominal pain.   °· You have low back pain that you never had before.   °· You feel your baby's head pushing down and causing pelvic pressure.   °· Your baby is not moving as much as it used to.   °  °This information is not intended to replace advice given to you by your health care provider. Make sure you discuss any questions you have with your health care   provider. °  °Document Released: 06/17/2005 Document Revised: 06/22/2013 Document Reviewed: 03/29/2013 °Elsevier Interactive Patient Education ©2016 Elsevier Inc. ° °

## 2015-11-02 NOTE — MAU Note (Addendum)
PNC-   AT HD-  WAS   THERE  TODAY -  TOLD  HAD PROTEIN IN URINE    AND  BLOOD.      STARTED FEELING UC-  645PM.   - MOSTLY IN HER BACK.   NO VE  TODAY.      DENIES HSV AND   MRSA. GBS-  NEG              IS  SUPPOSE TO RETURN  TOMORROW   TO CHECK URINE

## 2015-11-06 ENCOUNTER — Encounter (HOSPITAL_COMMUNITY): Payer: Self-pay | Admitting: General Practice

## 2015-11-06 ENCOUNTER — Inpatient Hospital Stay (HOSPITAL_COMMUNITY)
Admission: AD | Admit: 2015-11-06 | Discharge: 2015-11-06 | Disposition: A | Discharge status: Home / Self Care | Payer: Medicaid Other | Source: Ambulatory Visit | Attending: Obstetrics & Gynecology | Admitting: Obstetrics & Gynecology

## 2015-11-06 ENCOUNTER — Inpatient Hospital Stay (HOSPITAL_COMMUNITY)
Admission: AD | Admit: 2015-11-06 | Discharge: 2015-11-07 | DRG: 775 | Disposition: A | Discharge status: Home / Self Care | Payer: Medicaid Other | Source: Ambulatory Visit | Attending: Obstetrics & Gynecology | Admitting: Obstetrics & Gynecology

## 2015-11-06 ENCOUNTER — Encounter (HOSPITAL_COMMUNITY): Payer: Self-pay | Admitting: *Deleted

## 2015-11-06 DIAGNOSIS — Z3A39 39 weeks gestation of pregnancy: Secondary | ICD-10-CM

## 2015-11-06 DIAGNOSIS — Z23 Encounter for immunization: Secondary | ICD-10-CM

## 2015-11-06 LAB — CBC
HCT: 37.6 % (ref 36.0–46.0)
HEMOGLOBIN: 12.7 g/dL (ref 12.0–15.0)
MCH: 28.4 pg (ref 26.0–34.0)
MCHC: 33.8 g/dL (ref 30.0–36.0)
MCV: 84.1 fL (ref 78.0–100.0)
Platelets: 229 10*3/uL (ref 150–400)
RBC: 4.47 MIL/uL (ref 3.87–5.11)
RDW: 15.8 % — ABNORMAL HIGH (ref 11.5–15.5)
WBC: 12.1 10*3/uL — ABNORMAL HIGH (ref 4.0–10.5)

## 2015-11-06 LAB — TYPE AND SCREEN
ABO/RH(D): A POS
ANTIBODY SCREEN: NEGATIVE

## 2015-11-06 LAB — RPR: RPR: NONREACTIVE

## 2015-11-06 MED ORDER — OXYCODONE-ACETAMINOPHEN 5-325 MG PO TABS
2.0000 | ORAL_TABLET | ORAL | Status: DC | PRN
Start: 2015-11-06 — End: 2015-11-07

## 2015-11-06 MED ORDER — BENZOCAINE-MENTHOL 20-0.5 % EX AERO: 1.0000 "application " | INHALATION_SPRAY | CUTANEOUS | Status: DC | PRN

## 2015-11-06 MED ORDER — CITRIC ACID-SODIUM CITRATE 334-500 MG/5ML PO SOLN: 30.0000 mL | ORAL | Status: DC | PRN

## 2015-11-06 MED ORDER — DIPHENHYDRAMINE HCL 25 MG PO CAPS
25.0000 mg | ORAL_CAPSULE | Freq: Four times a day (QID) | ORAL | Status: DC | PRN
Start: 2015-11-06 — End: 2015-11-07

## 2015-11-06 MED ORDER — PRENATAL MULTIVITAMIN CH
1.0000 | ORAL_TABLET | Freq: Every day | ORAL | Status: DC
  Administered 2015-11-06 – 2015-11-07 (×2): 1 via ORAL
  Filled 2015-11-06 (×2): qty 1

## 2015-11-06 MED ORDER — OXYCODONE-ACETAMINOPHEN 5-325 MG PO TABS
1.0000 | ORAL_TABLET | ORAL | Status: DC | PRN
  Administered 2015-11-06: 1 via ORAL
  Filled 2015-11-06: qty 1

## 2015-11-06 MED ORDER — ONDANSETRON HCL 4 MG/2ML IJ SOLN: 4.0000 mg | Freq: Four times a day (QID) | INTRAMUSCULAR | Status: DC | PRN

## 2015-11-06 MED ORDER — OXYTOCIN BOLUS FROM INFUSION
500.0000 mL | INTRAVENOUS | Status: DC
  Administered 2015-11-06: 500 mL via INTRAVENOUS

## 2015-11-06 MED ORDER — LACTATED RINGERS IV SOLN
2.5000 [IU]/h | INTRAVENOUS | Status: DC
  Administered 2015-11-06: 2.5 [IU]/h via INTRAVENOUS

## 2015-11-06 MED ORDER — SIMETHICONE 80 MG PO CHEW: 80.0000 mg | CHEWABLE_TABLET | ORAL | Status: DC | PRN

## 2015-11-06 MED ORDER — LACTATED RINGERS IV SOLN
INTRAVENOUS | Status: DC
  Administered 2015-11-06: 04:00:00 via INTRAVENOUS

## 2015-11-06 MED ORDER — LIDOCAINE HCL (PF) 1 % IJ SOLN
30.0000 mL | INTRAMUSCULAR | Status: DC | PRN
  Filled 2015-11-06: qty 30

## 2015-11-06 MED ORDER — IBUPROFEN 600 MG PO TABS
600.0000 mg | ORAL_TABLET | Freq: Four times a day (QID) | ORAL | Status: DC
  Administered 2015-11-06 – 2015-11-07 (×6): 600 mg via ORAL
  Filled 2015-11-06 (×6): qty 1

## 2015-11-06 MED ORDER — SODIUM CHLORIDE 0.9% FLUSH: 3.0000 mL | Freq: Two times a day (BID) | INTRAVENOUS | Status: DC

## 2015-11-06 MED ORDER — SENNOSIDES-DOCUSATE SODIUM 8.6-50 MG PO TABS
2.0000 | ORAL_TABLET | ORAL | Status: DC
  Administered 2015-11-06: 2 via ORAL
  Filled 2015-11-06: qty 2

## 2015-11-06 MED ORDER — LACTATED RINGERS IV SOLN: 500.0000 mL | INTRAVENOUS | Status: DC | PRN

## 2015-11-06 MED ORDER — MEASLES, MUMPS & RUBELLA VAC ~~LOC~~ INJ
0.5000 mL | INJECTION | Freq: Once | SUBCUTANEOUS | Status: AC
  Administered 2015-11-07: 0.5 mL via SUBCUTANEOUS
  Filled 2015-11-06 (×2): qty 0.5

## 2015-11-06 MED ORDER — NALBUPHINE HCL 10 MG/ML IJ SOLN
10.0000 mg | Freq: Once | INTRAMUSCULAR | Status: AC
  Administered 2015-11-06: 10 mg via INTRAMUSCULAR
  Filled 2015-11-06: qty 1

## 2015-11-06 MED ORDER — SODIUM CHLORIDE 0.9% FLUSH: 3.0000 mL | INTRAVENOUS | Status: DC | PRN

## 2015-11-06 MED ORDER — ACETAMINOPHEN 325 MG PO TABS: 650.0000 mg | ORAL_TABLET | ORAL | Status: DC | PRN

## 2015-11-06 MED ORDER — SODIUM CHLORIDE 0.9 % IV SOLN: 250.0000 mL | INTRAVENOUS | Status: DC | PRN

## 2015-11-06 MED ORDER — LIDOCAINE HCL (PF) 1 % IJ SOLN
INTRAMUSCULAR | Status: AC
  Filled 2015-11-06: qty 30

## 2015-11-06 MED ORDER — TETANUS-DIPHTH-ACELL PERTUSSIS 5-2.5-18.5 LF-MCG/0.5 IM SUSP: 0.5000 mL | Freq: Once | INTRAMUSCULAR | Status: DC

## 2015-11-06 MED ORDER — ONDANSETRON HCL 4 MG/2ML IJ SOLN: 4.0000 mg | INTRAMUSCULAR | Status: DC | PRN

## 2015-11-06 MED ORDER — ZOLPIDEM TARTRATE 5 MG PO TABS: 5.0000 mg | ORAL_TABLET | Freq: Every evening | ORAL | Status: DC | PRN

## 2015-11-06 MED ORDER — OXYTOCIN 10 UNIT/ML IJ SOLN
INTRAVENOUS | Status: AC
  Filled 2015-11-06: qty 4

## 2015-11-06 MED ORDER — FLEET ENEMA 7-19 GM/118ML RE ENEM: 1.0000 | ENEMA | RECTAL | Status: DC | PRN

## 2015-11-06 MED ORDER — NALBUPHINE HCL 10 MG/ML IJ SOLN: 10.0000 mg | INTRAMUSCULAR | Status: DC | PRN

## 2015-11-06 MED ORDER — ONDANSETRON HCL 4 MG PO TABS: 4.0000 mg | ORAL_TABLET | ORAL | Status: DC | PRN

## 2015-11-06 MED ORDER — COCONUT OIL OIL: 1.0000 "application " | TOPICAL_OIL | Status: DC | PRN

## 2015-11-06 NOTE — Discharge Instructions (Signed)

## 2015-11-06 NOTE — MAU Note (Signed)
Contractions all day. No leaking or bleeding

## 2015-11-06 NOTE — Lactation Note (Signed)
This note was copied from a baby's chart. Lactation Consultation Note  Patient Name: Girl Percell Beltlexis Muzzy QMVHQ'IToday's Date: 11/06/2015 Reason for consult: Initial assessment   With this mom and term baby, now 6513 hours old. Mom 's choice on admission was formula feeding, and that is whet she has done so far, since birth. She told her nurse, emily H, that she wanted to try breastfeeding. I went in to ask if she wanted help with BF, and she asked if I could come back when she has less visitors. I told mom to call when she and baby were ready, and we would see her then.    Maternal Data Formula Feeding for Exclusion: Yes Reason for exclusion: Mother's choice to formula feed on admision Has patient been taught Hand Expression?: No Does the patient have breastfeeding experience prior to this delivery?: No  Feeding    LATCH Score/Interventions                      Lactation Tools Discussed/Used     Consult Status Consult Status: Follow-up Follow-up type: Call as needed    Alfred LevinsLee, Chantell Kunkler Anne 11/06/2015, 5:44 PM

## 2015-11-06 NOTE — MAU Note (Signed)
Pt states water broke at 310am-clear. Having some contractions. +FM. Denies vag bleeding.

## 2015-11-06 NOTE — H&P (Signed)
Laura Downs is a 30 y.o. female G3Jan Fireman2002 @ 39.5 wks  presenting for active labor and SROM. Maternal Medical History:  Reason for admission: Rupture of membranes and contractions.   Contractions: Onset was 6-12 hours ago.   Frequency: regular.   Perceived severity is strong.    Fetal activity: Perceived fetal activity is normal.   Last perceived fetal movement was within the past hour.    Prenatal complications: no prenatal complications Prenatal Complications - Diabetes: none.    OB History    Gravida Para Term Preterm AB TAB SAB Ectopic Multiple Living   3 2 2  0 0 0 0 0 0 2     Past Medical History  Diagnosis Date  . Vaginal Pap smear, abnormal    Past Surgical History  Procedure Laterality Date  . Leep  2008    paps have been normal since.   Family History: family history is not on file. Social History:  reports that she has never smoked. She has never used smokeless tobacco. She reports that she does not drink alcohol or use illicit drugs.   Prenatal Transfer Tool  Maternal Diabetes: No Genetic Screening: Normal Maternal Ultrasounds/Referrals: Normal Fetal Ultrasounds or other Referrals:  None Maternal Substance Abuse:  No Significant Maternal Medications:  None Significant Maternal Lab Results:  None Other Comments:  None  Review of Systems  Constitutional: Negative.   HENT: Negative.   Eyes: Negative.   Respiratory: Negative.   Cardiovascular: Negative.   Gastrointestinal: Positive for abdominal pain.  Genitourinary: Negative.   Musculoskeletal: Positive for back pain.  Skin: Negative.   Neurological: Negative.   Endo/Heme/Allergies: Negative.   Psychiatric/Behavioral: Negative.     Dilation: 4.5 Effacement (%): 70 Station: -2 Exam by:: Laura Downs. Simpson RN Last menstrual period 02/01/2015. Maternal Exam:  Uterine Assessment: Contraction strength is firm.  Contraction frequency is regular.   Abdomen: Patient reports no abdominal tenderness. Fetal  presentation: vertex  Introitus: Normal vulva. Normal vagina.  Ferning test: positive.  Amniotic fluid character: clear.  Pelvis: adequate for delivery.   Cervix: Cervix evaluated by digital exam.     Fetal Exam Fetal Monitor Review: Mode: ultrasound.   Variability: moderate (6-25 bpm).   Pattern: accelerations present.    Fetal State Assessment: Category I - tracings are normal.     Physical Exam  Constitutional: She is oriented to person, place, and time. She appears well-developed and well-nourished.  HENT:  Head: Normocephalic.  Eyes: Pupils are equal, round, and reactive to light.  Neck: Normal range of motion.  Cardiovascular: Normal rate, regular rhythm, normal heart sounds and intact distal pulses.   Respiratory: Effort normal and breath sounds normal.  GI: Soft. Bowel sounds are normal.  Genitourinary: Vagina normal.  Musculoskeletal: Normal range of motion.  Neurological: She is alert and oriented to person, place, and time. She has normal reflexes.  Skin: Skin is warm and dry.  Psychiatric: She has a normal mood and affect. Her behavior is normal. Judgment and thought content normal.    Prenatal labs: ABO, Rh: A/Positive/-- (10/13 0000) Antibody: Negative (10/13 0000) Rubella: Nonimmune (10/13 0000) RPR: Nonreactive (10/13 0000)  HBsAg: Negative (10/13 0000)  HIV: Non-reactive (10/13 0000)  GBS: Negative (04/19 0000)   Assessment/Plan: Admit impending delicvery, 8/100/0  Laura Downs 11/06/2015, 4:27 AM

## 2015-11-07 MED ORDER — IBUPROFEN 600 MG PO TABS: 600.0000 mg | ORAL_TABLET | Freq: Four times a day (QID) | ORAL | Status: DC

## 2015-11-07 NOTE — Discharge Instructions (Signed)

## 2015-11-07 NOTE — Discharge Summary (Signed)
OB Discharge Summary  Patient Name: Laura Downs DOB: 09/10/1985 MRN: [id number]  Date of admission: 11/06/2015 Delivering MD: Zerita BoersLAWSON, DARLENE   Date of discharge: 11/07/2015  Admitting diagnosis: 39 WEEKS ROM CTX Intrauterine pregnancy: 5564w5d     Secondary diagnosis:Principal Problem:   NSVD (normal spontaneous vaginal delivery)  Additional problems: None     Discharge diagnosis: Term Pregnancy Delivered                                                                     Post partum procedures:none  Augmentation: none  Complications: None  Hospital course:  Onset of Labor With Vaginal Delivery     30 y.o. yo G3P3003 at 4864w5d was admitted in Active Labor on 11/06/2015. Patient had an uncomplicated labor course as follows:  Membrane Rupture Time/Date: 3:00 AM ,11/06/2015   Intrapartum Procedures: Episiotomy:                                           Lacerations:  None [1]  Patient had a delivery of a Viable infant. 11/06/2015  Information for the patient's newborn:  Ernst Bowlerurvis, Girl Roslyn [[id number]][[id number]]  Delivery Method: Vaginal, Spontaneous Delivery (Filed from Delivery Summary)    Pateint had an uncomplicated postpartum course.  She is ambulating, tolerating a regular diet, passing flatus, and urinating well. Patient is discharged home in stable condition on 11/07/2015.    Physical exam  Filed Vitals:   11/06/15 0719 11/06/15 1145 11/06/15 1834 11/07/15 0558  BP: 138/73 132/78 129/80 117/88  Pulse: 83 93 101 94  Temp: 97.9 F (36.6 C) 98 F (36.7 C) 98.3 F (36.8 C) 97.6 F (36.4 C)  TempSrc: Oral Oral Oral Oral  Resp: 18 18 20 20   Height:      Weight:       General: alert, cooperative and no distress Lochia: appropriate Uterine Fundus: firm Incision: N/A DVT Evaluation: No evidence of DVT seen on physical exam. Labs: Lab Results  Component Value Date   WBC 12.1* 11/06/2015   HGB 12.7 11/06/2015   HCT 37.6 11/06/2015   MCV 84.1 11/06/2015   PLT 229  11/06/2015   CMP Latest Ref Rng 04/27/2014  Glucose 70 - 99 mg/dL 82  BUN 6 - 23 mg/dL 8  Creatinine 7.820.50 - 9.561.10 mg/dL 2.130.83  Sodium 086137 - 578147 mEq/L 139  Potassium 3.7 - 5.3 mEq/L 3.3(L)  Chloride 96 - 112 mEq/L 103  CO2 19 - 32 mEq/L 24  Calcium 8.4 - 10.5 mg/dL 8.9  Total Protein 6.0 - 8.3 g/dL 7.8  Total Bilirubin 0.3 - 1.2 mg/dL 0.5  Alkaline Phos 39 - 117 U/L 62  AST 0 - 37 U/L 14  ALT 0 - 35 U/L 9    Discharge instruction: per After Visit Summary and "Baby and Me Booklet".  After Visit Meds:    Medication List    TAKE these medications        butalbital-acetaminophen-caffeine 50-325-40 MG tablet  Commonly known as:  FIORICET  Take 1-2 tablets by mouth every 6 (six) hours as needed for headache.     [employer] PRENATAL GUMMY  0.4-113.5 MG Chew  Chew 2 each by mouth daily.     hydrOXYzine 50 MG capsule  Commonly known as:  VISTARIL  Take 1 capsule (50 mg total) by mouth every 6 (six) hours as needed (muscle relaxation).     ibuprofen 600 MG tablet  Commonly known as:  ADVIL,MOTRIN  Take 1 tablet (600 mg total) by mouth every 6 (six) hours.        Diet: routine diet  Activity: Advance as tolerated. Pelvic rest for 6 weeks.   Outpatient follow up:6 weeks Follow up Appt:No future appointments. Follow up visit: No Follow-up on file.  Postpartum contraception: Depo Provera  Newborn Data: Live born female  Birth Weight: 7 lb 3.5 oz (3274 g) APGAR: 8, 10  Baby Feeding: Bottle and Breast Disposition:home with mother   11/07/2015 Hilton Sinclair, MD  CNM attestation I have seen and examined this patient and agree with above documentation in the resident's note.   ROYANNE WARSHAW is a 30 y.o. [phone] s/p SVD.   Pain is well controlled.  Plan for birth control is Depo-Provera.  Method of Feeding: both  PE:  BP 117/88 mmHg  Pulse 94  Temp(Src) 97.6 F (36.4 C) (Oral)  Resp 20  Ht  (1.626 m)  Wt 104.282 kg (229 lb 14.4 oz)  BMI 39.44 kg/m2  LMP 02/01/2015   Breastfeeding? Unknown Fundus firm   Recent Labs  11/06/15 0350  HGB 12.7  HCT 37.6     Plan: discharge today - postpartum care discussed - f/u clinic in 6 weeks for postpartum visit   Cam Hai, CNM 7:50 AM  11/07/2015

## 2015-11-12 ENCOUNTER — Encounter (HOSPITAL_COMMUNITY): Payer: Self-pay | Admitting: Emergency Medicine

## 2015-11-12 ENCOUNTER — Emergency Department (HOSPITAL_COMMUNITY)
Admission: EM | Admit: 2015-11-12 | Discharge: 2015-11-12 | Disposition: A | Discharge status: Home / Self Care | Payer: Medicaid Other | Attending: Emergency Medicine | Admitting: Emergency Medicine

## 2015-11-12 DIAGNOSIS — O9089 Other complications of the puerperium, not elsewhere classified: Secondary | ICD-10-CM | POA: Insufficient documentation

## 2015-11-12 DIAGNOSIS — M25571 Pain in right ankle and joints of right foot: Secondary | ICD-10-CM | POA: Diagnosis not present

## 2015-11-12 DIAGNOSIS — M25572 Pain in left ankle and joints of left foot: Secondary | ICD-10-CM | POA: Diagnosis not present

## 2015-11-12 DIAGNOSIS — R609 Edema, unspecified: Secondary | ICD-10-CM | POA: Insufficient documentation

## 2015-11-12 DIAGNOSIS — Z79899 Other long term (current) drug therapy: Secondary | ICD-10-CM | POA: Diagnosis not present

## 2015-11-12 NOTE — ED Notes (Signed)
Pt states she had a baby Monday,  "my feet have been swollen since before I had my baby". "they just said it was going to go down, but it feels like it got worser". Pt endorses pain when someone touches her feet or when she stands. Pulses palpable in feet

## 2015-11-12 NOTE — ED Provider Notes (Signed)
CSN: [id number]     Arrival date & time 11/12/15  1819 History  By signing my name below, I, Mesha Guinyard, attest that this documentation has been prepared under the direction and in the presence of Fayrene Helper - PA.  Electronically Signed: Arvilla Market, Medical Scribe. 11/12/2015. 6:45 PM.   Chief Complaint  Patient presents with  . Foot Pain   HPI HPI Comments: Laura Downs is a 30 y.o. female who gave birth 6 Days ago presents to the Emergency Department complaining of bilateral foot pain. Pt reports she has worsening edema in her foot with associated pain while walking after giving birth 6 days ago. Pt recalls having edema throughout her pregnancy, and denies any complications, or blood clots during her pregnancy. Pt recalls having high bp the last week of her pregnancy. She states that she has been limping and her right foot felt worse today. She denies taking anything for pain relief. Pt states her baby came two days early.  Past Medical History  Diagnosis Date  . Vaginal Pap smear, abnormal   . Pregnancy    Past Surgical History  Procedure Laterality Date  . Leep  2008    paps have been normal since.   No family history on file. Social History  Substance Use Topics  . Smoking status: Never Smoker   . Smokeless tobacco: Never Used  . Alcohol Use: No   OB History    Gravida Para Term Preterm AB TAB SAB Ectopic Multiple Living   0 0 0 0 0 0 3     Review of Systems  Respiratory: Negative for chest tightness.   Cardiovascular: Negative for chest pain.  Gastrointestinal: Negative for abdominal pain.   Allergies  Review of patient's allergies indicates no known allergies.  Home Medications   Prior to Admission medications   Medication Sig Start Date End Date Taking? Authorizing Provider  butalbital-acetaminophen-caffeine (FIORICET) 50-325-40 MG tablet Take 1-2 tablets by mouth every 6 (six) hours as needed for headache. Patient not taking: Reported on  11/06/2015 09/07/15 09/06/16  Jean Rosenthal, NP  hydrOXYzine (VISTARIL) 50 MG capsule Take 1 capsule (50 mg total) by mouth every 6 (six) hours as needed (muscle relaxation). Patient not taking: Reported on 11/06/2015 11/02/15   Caesar Chestnut, MD  ibuprofen (ADVIL,MOTRIN) 600 MG tablet Take 1 tablet (600 mg total) by mouth every 6 (six) hours. 11/07/15   Campbell Stall, MD  Prenatal Vit-Min-FA-Fish Oil ([employer] PRENATAL GUMMY) 0.4-113.5 MG CHEW Chew 2 each by mouth daily.    Historical Provider, MD   BP 136/84 mmHg  Pulse 79  Temp(Src) 98.7 F (37.1 C) (Oral)  Resp 20  SpO2 100% Physical Exam  Neck: No JVD present.  Cardiovascular: Normal rate, regular rhythm and normal heart sounds.  Exam reveals no gallop and no friction rub.   No murmur heard. Pulmonary/Chest: Breath sounds normal. No respiratory distress. She has no wheezes. She has no rales.  Abdominal: Soft. There is no tenderness.  Musculoskeletal:  2+ pitting edema Bilateral lower extremity without accord or erythema Calf compartments are soft Brisk capillaries to her toes No palpable cord or erythema    ED Course  Procedures (including critical care time) DIAGNOSTIC STUDIES: Oxygen Saturation is 100% on RA, nl by my interpretation.    COORDINATION OF CARE: 6:45 PM Discussed treatment plan with pt at bedside and pt agreed to plan. We talked about lifting up her legs for relief, and continuing to be  active to help subside her edema. Pt mentions concern about how long her feet will have edema in her feet, and we talked about the worse case scenario of having a blood clot in her legs. We talked about coming back in for blood work, or if she has any other associated symptoms surface such as dyspnea, or hemoptysis. At this time, low suspicion for DVT, renal failure, CHF or postpartum cardiomyopathy.  Pt understand to return if her sxs worsen.  She will f/u with her OBGYN for further care.    MDM   Final diagnoses:  Peripheral edema     BP 136/84 mmHg  Pulse 79  Temp(Src) 98.7 F (37.1 C) (Oral)  Resp 20  SpO2 100%  I personally performed the services described in this documentation, which was scribed in my presence. The recorded information has been reviewed and is accurate.      Fayrene HelperBowie Laketra Bowdish, PA-C 11/12/15 1936  Arby BarretteMarcy Pfeiffer, MD 11/18/15 [phone]

## 2015-11-12 NOTE — Discharge Instructions (Signed)
Please stay active and exercise to help decrease leg swelling.  Keep leg elevated above heart when resting to help decrease swelling.  Follow up with your OBGYN for further care.  Return if you develop acute onset of shortness of breath, coughing up blood, having chest pain or if you have other concerns.   Peripheral Edema You have swelling in your legs (peripheral edema). This swelling is due to excess accumulation of salt and water in your body. Edema may be a sign of heart, kidney or liver disease, or a side effect of a medication. It may also be due to problems in the leg veins. Elevating your legs and using special support stockings may be very helpful, if the cause of the swelling is due to poor venous circulation. Avoid long periods of standing, whatever the cause. Treatment of edema depends on identifying the cause. Chips, pretzels, pickles and other salty foods should be avoided. Restricting salt in your diet is almost always needed. Water pills (diuretics) are often used to remove the excess salt and water from your body via urine. These medicines prevent the kidney from reabsorbing sodium. This increases urine flow. Diuretic treatment may also result in lowering of potassium levels in your body. Potassium supplements may be needed if you have to use diuretics daily. Daily weights can help you keep track of your progress in clearing your edema. You should call your caregiver for follow up care as recommended. SEEK IMMEDIATE MEDICAL CARE IF:   You have increased swelling, pain, redness, or heat in your legs.  You develop shortness of breath, especially when lying down.  You develop chest or abdominal pain, weakness, or fainting.  You have a fever.   This information is not intended to replace advice given to you by your health care provider. Make sure you discuss any questions you have with your health care provider.   Document Released: 07/25/2004 Document Revised: 09/09/2011 Document  Reviewed: 12/28/2014 Elsevier Interactive Patient Education [employer]2016 Elsevier Inc.

## 2015-12-22 ENCOUNTER — Ambulatory Visit: Payer: Medicaid Other

## 2015-12-22 VITALS — BP 116/81 | HR 72

## 2015-12-22 DIAGNOSIS — Z0130 Encounter for examination of blood pressure without abnormal findings: Secondary | ICD-10-CM

## 2015-12-22 NOTE — Progress Notes (Signed)
[facility]. Per patient she has a list of primary care providers who she is planning on calling for appt.

## 2017-04-29 ENCOUNTER — Encounter (HOSPITAL_COMMUNITY): Payer: Self-pay | Admitting: *Deleted

## 2017-04-29 ENCOUNTER — Inpatient Hospital Stay (HOSPITAL_COMMUNITY): Payer: Medicaid Other

## 2017-04-29 ENCOUNTER — Inpatient Hospital Stay (HOSPITAL_COMMUNITY)
Admission: AD | Admit: 2017-04-29 | Discharge: 2017-04-29 | Disposition: A | Discharge status: Home / Self Care | Payer: Medicaid Other | Source: Ambulatory Visit | Attending: Obstetrics and Gynecology | Admitting: Obstetrics and Gynecology

## 2017-04-29 DIAGNOSIS — O418X10 Other specified disorders of amniotic fluid and membranes, first trimester, not applicable or unspecified: Secondary | ICD-10-CM

## 2017-04-29 DIAGNOSIS — Z9889 Other specified postprocedural states: Secondary | ICD-10-CM | POA: Insufficient documentation

## 2017-04-29 DIAGNOSIS — O161 Unspecified maternal hypertension, first trimester: Secondary | ICD-10-CM | POA: Diagnosis not present

## 2017-04-29 DIAGNOSIS — Z3A01 Less than 8 weeks gestation of pregnancy: Secondary | ICD-10-CM | POA: Insufficient documentation

## 2017-04-29 DIAGNOSIS — R109 Unspecified abdominal pain: Secondary | ICD-10-CM

## 2017-04-29 DIAGNOSIS — O23591 Infection of other part of genital tract in pregnancy, first trimester: Secondary | ICD-10-CM | POA: Insufficient documentation

## 2017-04-29 DIAGNOSIS — O468X1 Other antepartum hemorrhage, first trimester: Secondary | ICD-10-CM

## 2017-04-29 DIAGNOSIS — O9989 Other specified diseases and conditions complicating pregnancy, childbirth and the puerperium: Secondary | ICD-10-CM

## 2017-04-29 DIAGNOSIS — O208 Other hemorrhage in early pregnancy: Secondary | ICD-10-CM | POA: Insufficient documentation

## 2017-04-29 DIAGNOSIS — B9689 Other specified bacterial agents as the cause of diseases classified elsewhere: Secondary | ICD-10-CM | POA: Diagnosis not present

## 2017-04-29 DIAGNOSIS — N760 Acute vaginitis: Secondary | ICD-10-CM | POA: Insufficient documentation

## 2017-04-29 DIAGNOSIS — O26891 Other specified pregnancy related conditions, first trimester: Secondary | ICD-10-CM | POA: Insufficient documentation

## 2017-04-29 DIAGNOSIS — Z3491 Encounter for supervision of normal pregnancy, unspecified, first trimester: Secondary | ICD-10-CM

## 2017-04-29 HISTORY — DX: Essential (primary) hypertension: I10

## 2017-04-29 LAB — WET PREP, GENITAL
SPERM: NONE SEEN
TRICH WET PREP: NONE SEEN
Yeast Wet Prep HPF POC: NONE SEEN

## 2017-04-29 LAB — URINALYSIS, ROUTINE W REFLEX MICROSCOPIC
Bilirubin Urine: NEGATIVE
GLUCOSE, UA: NEGATIVE mg/dL
HGB URINE DIPSTICK: NEGATIVE
KETONES UR: NEGATIVE mg/dL
Leukocytes, UA: NEGATIVE
Nitrite: NEGATIVE
PH: 7 (ref 5.0–8.0)
Protein, ur: NEGATIVE mg/dL
Specific Gravity, Urine: 1.012 (ref 1.005–1.030)

## 2017-04-29 LAB — CBC
HCT: 38.2 % (ref 36.0–46.0)
Hemoglobin: 13.1 g/dL (ref 12.0–15.0)
MCH: 31 pg (ref 26.0–34.0)
MCHC: 34.3 g/dL (ref 30.0–36.0)
MCV: 90.5 fL (ref 78.0–100.0)
PLATELETS: 264 10*3/uL (ref 150–400)
RBC: 4.22 MIL/uL (ref 3.87–5.11)
RDW: 14.2 % (ref 11.5–15.5)
WBC: 6 10*3/uL (ref 4.0–10.5)

## 2017-04-29 LAB — HCG, QUANTITATIVE, PREGNANCY: hCG, Beta Chain, Quant, S: 5711 m[IU]/mL — ABNORMAL HIGH (ref <5)

## 2017-04-29 LAB — OB RESULTS CONSOLE GC/CHLAMYDIA
Chlamydia: NEGATIVE
Gonorrhea: NEGATIVE

## 2017-04-29 LAB — POCT PREGNANCY, URINE: Preg Test, Ur: POSITIVE — AB

## 2017-04-29 MED ORDER — METRONIDAZOLE 500 MG PO TABS: 500.0000 mg | ORAL_TABLET | Freq: Two times a day (BID) | ORAL | 0 refills | Status: DC

## 2017-04-29 NOTE — MAU Provider Note (Signed)
History     CSN: [id number]  Arrival date and time: 04/29/17 4098   First Provider Initiated Contact with Patient 04/29/17 1043      Chief Complaint  Patient presents with  . Abdominal Pain   HPI ANAB VIVAR is a 31 y.o. [phone] at [redacted]w[redacted]d by LMP who presents with abdominal pain. Symptoms began last Thursday. Lower abdominal paint that is intermittent & she describes as sharp cramping. Rates pain 7/10. Has not treated pain. Denies n/v/d, constipation, dysuria, vaginal discharge, or vaginal bleeding. Last intercourse >1 week ago. Last BM this morning.   OB History    Gravida Para Term Preterm AB Living   4 3 3  0 0 3   SAB TAB Ectopic Multiple Live Births   0 0 0 0 1      Past Medical History:  Diagnosis Date  . Hypertension   . Vaginal Pap smear, abnormal     Past Surgical History:  Procedure Laterality Date  . LEEP  2008   paps have been normal since.    History reviewed. No pertinent family history.  Social History  Substance Use Topics  . Smoking status: Never Smoker  . Smokeless tobacco: Never Used  . Alcohol use No    Allergies: No Known Allergies  Prescriptions Prior to Admission  Medication Sig Dispense Refill Last Dose  . hydrOXYzine (VISTARIL) 50 MG capsule Take 1 capsule (50 mg total) by mouth every 6 (six) hours as needed (muscle relaxation). (Patient not taking: Reported on 11/06/2015) 12 capsule 0   . ibuprofen (ADVIL,MOTRIN) 600 MG tablet Take 1 tablet (600 mg total) by mouth every 6 (six) hours. 30 tablet 0   . Prenatal Vit-Min-FA-Fish Oil ([employer] PRENATAL GUMMY) 0.4-113.5 MG CHEW Chew 2 each by mouth daily.   Past Month at Unknown time    Review of Systems  Constitutional: Negative.   Gastrointestinal: Positive for abdominal pain. Negative for constipation, diarrhea, nausea and vomiting.  Genitourinary: Negative.  Negative for dysuria, vaginal bleeding and vaginal discharge.   Physical Exam   Blood pressure 135/90, pulse 85, temperature 97.9  F (36.6 C), temperature source Oral, resp. rate 18, weight 230 lb 1.9 oz (104.4 kg), last menstrual period 03/21/2017, SpO2 100 %, unknown if currently breastfeeding.  Physical Exam  Nursing note and vitals reviewed. Constitutional: She is oriented to person, place, and time. She appears well-developed and well-nourished. No distress.  HENT:  Head: Normocephalic and atraumatic.  Eyes: Conjunctivae are normal. Right eye exhibits no discharge. Left eye exhibits no discharge. No scleral icterus.  Neck: Normal range of motion.  Respiratory: Effort normal. No respiratory distress.  GI: Soft. She exhibits no distension. There is no tenderness. There is no rebound and no guarding.  Genitourinary: Uterus normal. Cervix exhibits no motion tenderness. Right adnexum displays no mass and no tenderness. Left adnexum displays no mass and no tenderness.  Genitourinary Comments: Cervix closed  Neurological: She is alert and oriented to person, place, and time.  Skin: Skin is warm and dry. She is not diaphoretic.  Psychiatric: She has a normal mood and affect. Her behavior is normal. Judgment and thought content normal.    MAU Course  Procedures Results for orders placed or performed during the hospital encounter of 04/29/17 (from the past 48 hour(s))  Pregnancy, urine POC     Status: Abnormal   Collection Time: 04/29/17 10:16 AM  Result Value Ref Range   Preg Test, Ur POSITIVE (A) NEGATIVE    Comment:  THE SENSITIVITY OF THIS METHODOLOGY IS >24 mIU/mL   CBC     Status: None   Collection Time: 04/29/17 11:30 AM  Result Value Ref Range   WBC 6.0 4.0 - 10.5 K/uL   RBC 4.22 3.87 - 5.11 MIL/uL   Hemoglobin 13.1 12.0 - 15.0 g/dL   HCT 81.1 91.4 - 78.2 %   MCV 90.5 78.0 - 100.0 fL   MCH 31.0 26.0 - 34.0 pg   MCHC 34.3 30.0 - 36.0 g/dL   RDW 95.6 21.3 - 08.6 %   Platelets 264 150 - 400 K/uL  Wet prep, genital     Status: Abnormal   Collection Time: 04/29/17 11:30 AM  Result Value Ref Range    Yeast Wet Prep HPF POC NONE SEEN NONE SEEN   Trich, Wet Prep NONE SEEN NONE SEEN   Clue Cells Wet Prep HPF POC PRESENT (A) NONE SEEN   WBC, Wet Prep HPF POC MANY (A) NONE SEEN    Comment: MODERATE BACTERIA SEEN   Sperm NONE SEEN   hCG, quantitative, pregnancy     Status: Abnormal   Collection Time: 04/29/17 11:53 AM  Result Value Ref Range   hCG, Beta Chain, Quant, S 5,711 (H) <5 mIU/mL    Comment:          GEST. AGE      CONC.  (mIU/mL)   <=1 WEEK        5 - 50     2 WEEKS       50 - 500     3 WEEKS       100 - 10,000     4 WEEKS     1,000 - 30,000     5 WEEKS     3,500 - 115,000   6-8 WEEKS     12,000 - 270,000    12 WEEKS     15,000 - 220,000        FEMALE AND NON-PREGNANT FEMALE:     LESS THAN 5 mIU/mL    US Ob Comp Less 14 Wks  Result Date: 04/29/2017 CLINICAL DATA:  Abdominal pain during pregnancy. Clinical gestational age of [redacted] weeks and 4 days. EXAM: OBSTETRIC <14 WK [place] AND TRANSVAGINAL OB US TECHNIQUE: Both transabdominal and transvaginal ultrasound examinations were performed for complete evaluation of the gestation as well as the maternal uterus, adnexal regions, and pelvic cul-de-sac. Transvaginal technique was performed to assess early pregnancy. COMPARISON:  None. FINDINGS: Intrauterine gestational sac: Single Yolk sac:  Visualized. Embryo:  Not Visualized. MSD: 8.5  mm   5 w   4  d Subchorionic hemorrhage:  Small subchorionic hemorrhage noted. Maternal uterus/adnexae: Right ovary: There is a 2.6 x 2.1 x 2.4 cm (volume = 6.9 cm^3) complicated cystic structure in the right adnexum containing soft tissue, cystic and echogenic components suggestive of a dermoid. Left ovary: Corpus luteal cyst noted. Other :None Free fluid:  None IMPRESSION: 1. Probable early intrauterine gestational sac containing a yolk sac, but no fetal pole, or cardiac activity yet visualized. Recommend follow-up quantitative B-HCG levels and follow-up US in 14 days to assess viability. This recommendation  follows SRU consensus guidelines: Diagnostic Criteria for Nonviable Pregnancy Early in the First Trimester. Malva Limes Med 2013; 578:4696-29. 2. Small subchorionic hemorrhage. Electronically Signed   By: Signa Kell M.D.   On: 04/29/2017 13:46   US Ob Transvaginal  Result Date: 04/29/2017 CLINICAL DATA:  Abdominal pain during pregnancy. Clinical gestational age of [redacted] weeks  and 4 days. EXAM: OBSTETRIC <14 WK US AND TRANSVAGINAL OB US TECHNIQUE: Both transabdominal and transvaginal ultrasound examinations were performed for complete evaluation of the gestation as well as the maternal uterus, adnexal regions, and pelvic cul-de-sac. Transvaginal technique was performed to assess early pregnancy. COMPARISON:  None. FINDINGS: Intrauterine gestational sac: Single Yolk sac:  Visualized. Embryo:  Not Visualized. MSD: 8.5  mm   5 w   4  d Subchorionic hemorrhage:  Small subchorionic hemorrhage noted. Maternal uterus/adnexae: Right ovary: There is a 2.6 x 2.1 x 2.4 cm (volume = 6.9 cm^3) complicated cystic structure in the right adnexum containing soft tissue, cystic and echogenic components suggestive of a dermoid. Left ovary: Corpus luteal cyst noted. Other :None Free fluid:  None IMPRESSION: 1. Probable early intrauterine gestational sac containing a yolk sac, but no fetal pole, or cardiac activity yet visualized. Recommend follow-up quantitative B-HCG levels and follow-up US in 14 days to assess viability. This recommendation follows SRU consensus guidelines: Diagnostic Criteria for Nonviable Pregnancy Early in the First Trimester. Malva Limes Engl J Med 2013; 161:0960-45; 369:1443-51. 2. Small subchorionic hemorrhage. Electronically Signed   By: Signa Kellaylor  Stroud M.D.   On: 04/29/2017 13:46    MDM +UPT UA, wet prep, GC/chlamydia, CBC, ABO/Rh, quant hCG, HIV, and US today to rule out ectopic pregnancy A positive Ultrasound shows IUGS with yolk sac & small SCH  Assessment and Plan  A; 1. Less than [redacted] weeks gestation of pregnancy   2.  Abdominal pain during pregnancy in first trimester   3. Normal IUP (intrauterine pregnancy) on prenatal ultrasound, first trimester   4. Subchorionic hematoma in first trimester, single or unspecified fetus   5. BV (bacterial vaginosis)    P: Discharge home Rx flagyl Discussed reasons to return to MAU Start prenatal care GC/CT pending   Judeth Hornrin Inaaya Vellucci 04/29/2017, 10:43 AM

## 2017-04-29 NOTE — Discharge Instructions (Signed)
Subchorionic Hematoma A subchorionic hematoma is a gathering of blood between the outer wall of the placenta and the inner wall of the womb (uterus). The placenta is the organ that connects the fetus to the wall of the uterus. The placenta performs the feeding, breathing (oxygen to the fetus), and waste removal (excretory work) of the fetus. Subchorionic hematoma is the most common abnormality found on a result from ultrasonography done during the first trimester or early second trimester of pregnancy. If there has been little or no vaginal bleeding, early small hematomas usually shrink on their own and do not affect your baby or pregnancy. The blood is gradually absorbed over 1-2 weeks. When bleeding starts later in pregnancy or the hematoma is larger or occurs in an older pregnant woman, the outcome may not be as good. Larger hematomas may get bigger, which increases the chances for miscarriage. Subchorionic hematoma also increases the risk of premature detachment of the placenta from the uterus, preterm (premature) labor, and stillbirth. Follow these instructions at home:  Stay on bed rest if your health care provider recommends this. Although bed rest will not prevent more bleeding or prevent a miscarriage, your health care provider may recommend bed rest until you are advised otherwise.  Avoid heavy lifting (more than 10 lb [4.5 kg]), exercise, sexual intercourse, or douching as directed by your health care provider.  Keep track of the number of pads you use each day and how soaked (saturated) they are. Write down this information.  Do not use tampons.  Keep all follow-up appointments as directed by your health care provider. Your health care provider may ask you to have follow-up blood tests or ultrasound tests or both. Get help right away if:  You have severe cramps in your stomach, back, abdomen, or pelvis.  You have a fever.  You pass large clots or tissue. Save any tissue for your  health care provider to look at.  Your bleeding increases or you become lightheaded, feel weak, or have fainting episodes. This information is not intended to replace advice given to you by your health care provider. Make sure you discuss any questions you have with your health care provider. Document Released: 10/02/2006 Document Revised: 11/23/2015 Document Reviewed: 01/14/2013 Elsevier Interactive Patient Education  2017 Else[employer] Inc. Bacterial Vaginosis Bacterial vaginosis is a vaginal infection that occurs when the normal balance of bacteria in the vagina is disrupted. It results from an overgrowth of certain bacteria. This is the most common vaginal infection among women ages 70-44. Because bacterial vaginosis increases your risk for STIs (sexually transmitted infections), getting treated can help reduce your risk for chlamydia, gonorrhea, herpes, and HIV (human immunodeficiency virus). Treatment is also important for preventing complications in pregnant women, because this condition can cause an early (premature) delivery. What are the causes? This condition is caused by an increase in harmful bacteria that are normally present in small amounts in the vagina. However, the reason that the condition develops is not fully understood. What increases the risk? The following factors may make you more likely to develop this condition:  Having a new sexual partner or multiple sexual partners.  Having unprotected sex.  Douching.  Having an intrauterine device (IUD).  Smoking.  Drug and alcohol abuse.  Taking certain antibiotic medicines.  Being pregnant.  You cannot get bacterial vaginosis from toilet seats, bedding, swimming pools, or contact with objects around you. What are the signs or symptoms? Symptoms of this condition include:  Grey or white vaginal  discharge. The discharge can also be watery or foamy.  A fish-like odor with discharge, especially after sexual intercourse or  during menstruation.  Itching in and around the vagina.  Burning or pain with urination.  Some women with bacterial vaginosis have no signs or symptoms. How is this diagnosed? This condition is diagnosed based on:  Your medical history.  A physical exam of the vagina.  Testing a sample of vaginal fluid under a microscope to look for a large amount of bad bacteria or abnormal cells. Your health care provider may use a cotton swab or a small wooden spatula to collect the sample.  How is this treated? This condition is treated with antibiotics. These may be given as a pill, a vaginal cream, or a medicine that is put into the vagina (suppository). If the condition comes back after treatment, a second round of antibiotics may be needed. Follow these instructions at home: Medicines  Take over-the-counter and prescription medicines only as told by your health care provider.  Take or use your antibiotic as told by your health care provider. Do not stop taking or using the antibiotic even if you start to feel better. General instructions  If you have a female sexual partner, tell her that you have a vaginal infection. She should see her health care provider and be treated if she has symptoms. If you have a female sexual partner, he does not need treatment.  During treatment: ? Avoid sexual activity until you finish treatment. ? Do not douche. ? Avoid alcohol as directed by your health care provider. ? Avoid breastfeeding as directed by your health care provider.  Drink enough water and fluids to keep your urine clear or pale yellow.  Keep the area around your vagina and rectum clean. ? Wash the area daily with warm water. ? Wipe yourself from front to back after using the toilet.  Keep all follow-up visits as told by your health care provider. This is important. How is this prevented?  Do not douche.  Wash the outside of your vagina with warm water only.  Use protection when  having sex. This includes latex condoms and dental dams.  Limit how many sexual partners you have. To help prevent bacterial vaginosis, it is best to have sex with just one partner (monogamous).  Make sure you and your sexual partner are tested for STIs.  Wear cotton or cotton-lined underwear.  Avoid wearing tight pants and pantyhose, especially during summer.  Limit the amount of alcohol that you drink.  Do not use any products that contain nicotine or tobacco, such as cigarettes and e-cigarettes. If you need help quitting, ask your health care provider.  Do not use illegal drugs. Where to find more information:  Centers for Disease Control and Prevention: SolutionApps.co.zawww.cdc.gov/std  American Sexual Health Association (ASHA): www.ashastd.org  [place].S[place] Department of Health and Health and safety inspectorHuman Services, Office on Women's Health: ConventionalMedicines.siwww.womenshealth.gov/ or http://www.anderson-williamson.info/https://www.womenshealth.gov/a-z-topics/bacterial-vaginosis Contact a health care provider if:  Your symptoms do not improve, even after treatment.  You have more discharge or pain when urinating.  You have a fever.  You have pain in your abdomen.  You have pain during sex.  You have vaginal bleeding between periods. Summary  Bacterial vaginosis is a vaginal infection that occurs when the normal balance of bacteria in the vagina is disrupted.  Because bacterial vaginosis increases your risk for STIs (sexually transmitted infections), getting treated can help reduce your risk for chlamydia, gonorrhea, herpes, and HIV (human immunodeficiency virus). Treatment is  also important for preventing complications in pregnant women, because the condition can cause an early (premature) delivery.  This condition is treated with antibiotic medicines. These may be given as a pill, a vaginal cream, or a medicine that is put into the vagina (suppository). This information is not intended to replace advice given to you by your health care provider. Make sure you  discuss any questions you have with your health care provider. Document Released: 06/17/2005 Document Revised: 03/02/2016 Document Reviewed: 03/02/2016 Elsevier Interactive Patient Education  2017 [employer].

## 2017-04-29 NOTE — MAU Note (Signed)
+  lower abdominal pain 3-4 days Intermittent  Sharp pain  +HPT LMP 03/21/17  Denies vaginal bleeding or discharge.

## 2017-04-29 NOTE — Progress Notes (Signed)
Blind cultures obtained by Estanislado SpireE. Lawrence, NP.

## 2017-04-29 NOTE — MAU Note (Signed)
Pt presents with c/o intermittent sharp abdominal pain located across lower abdomen that began 3-4 days ago.  Denies VB.  +UPT @ home.

## 2017-04-30 LAB — GC/CHLAMYDIA PROBE AMP (~~LOC~~) NOT AT ARMC
CHLAMYDIA, DNA PROBE: NEGATIVE
Neisseria Gonorrhea: NEGATIVE

## 2017-05-27 ENCOUNTER — Encounter (HOSPITAL_COMMUNITY): Payer: Self-pay | Admitting: *Deleted

## 2017-05-27 ENCOUNTER — Other Ambulatory Visit: Payer: Self-pay

## 2017-05-27 ENCOUNTER — Inpatient Hospital Stay (HOSPITAL_COMMUNITY)
Admission: AD | Admit: 2017-05-27 | Discharge: 2017-05-27 | Disposition: A | Discharge status: Home / Self Care | Payer: Medicaid Other | Source: Ambulatory Visit | Attending: Family Medicine | Admitting: Family Medicine

## 2017-05-27 DIAGNOSIS — Z3A09 9 weeks gestation of pregnancy: Secondary | ICD-10-CM | POA: Insufficient documentation

## 2017-05-27 DIAGNOSIS — O161 Unspecified maternal hypertension, first trimester: Secondary | ICD-10-CM | POA: Insufficient documentation

## 2017-05-27 DIAGNOSIS — M545 Low back pain, unspecified: Secondary | ICD-10-CM

## 2017-05-27 DIAGNOSIS — O26891 Other specified pregnancy related conditions, first trimester: Secondary | ICD-10-CM | POA: Insufficient documentation

## 2017-05-27 DIAGNOSIS — Z79899 Other long term (current) drug therapy: Secondary | ICD-10-CM | POA: Insufficient documentation

## 2017-05-27 DIAGNOSIS — R110 Nausea: Secondary | ICD-10-CM | POA: Insufficient documentation

## 2017-05-27 LAB — URINALYSIS, ROUTINE W REFLEX MICROSCOPIC
BILIRUBIN URINE: NEGATIVE
GLUCOSE, UA: NEGATIVE mg/dL
HGB URINE DIPSTICK: NEGATIVE
Ketones, ur: NEGATIVE mg/dL
LEUKOCYTES UA: NEGATIVE
NITRITE: NEGATIVE
PROTEIN: 30 mg/dL — AB
Specific Gravity, Urine: 1.028 (ref 1.005–1.030)
pH: 6 (ref 5.0–8.0)

## 2017-05-27 LAB — WET PREP, GENITAL
Clue Cells Wet Prep HPF POC: NONE SEEN
SPERM: NONE SEEN
Trich, Wet Prep: NONE SEEN
WBC WET PREP: NONE SEEN
YEAST WET PREP: NONE SEEN

## 2017-05-27 MED ORDER — ACETAMINOPHEN 500 MG PO TABS
1000.0000 mg | ORAL_TABLET | Freq: Once | ORAL | Status: AC
  Administered 2017-05-27: 1000 mg via ORAL
  Filled 2017-05-27: qty 2

## 2017-05-27 NOTE — MAU Provider Note (Signed)
History     CSN: [id number]  Arrival date and time: 05/27/17 [id number]   First Provider Initiated Contact with Patient 05/27/17 0914      Chief Complaint  Patient presents with  . Back Pain  . Nausea   HPI  Laura Downs is a 31 y.o. [id number] at 7720w4d who presents with back pain & nausea. Symptoms began 2 weeks ago. Reports intermittent low back pain that she describes as aching. Rates pain 9/10. Has not treated symptoms. Endorses some nausea, no vomiting. Vaginal discharge this week. Thick white discharge with foul odor. Previously diagnosed with BV but did not complete treatment. Denies abdominal pain, dysuria, or vaginal bleeding. Initial OB appt scheduled with [facility] on 12/4.  OB History    Gravida Para Term Preterm AB Living   4 3 3  0 0 3   SAB TAB Ectopic Multiple Live Births   0 0 0 0 1      Past Medical History:  Diagnosis Date  . Hypertension   . Vaginal Pap smear, abnormal     Past Surgical History:  Procedure Laterality Date  . LEEP  2008   paps have been normal since.    History reviewed. No pertinent family history.  Social History   Tobacco Use  . Smoking status: Never Smoker  . Smokeless tobacco: Never Used  Substance Use Topics  . Alcohol use: No  . Drug use: No    Allergies: No Known Allergies  Medications Prior to Admission  Medication Sig Dispense Refill Last Dose  . acetaminophen (TYLENOL) 500 MG tablet Take 500 mg by mouth every 6 (six) hours as needed for mild pain or headache.   Past Week at Unknown time  . metroNIDAZOLE (FLAGYL) 500 MG tablet Take 1 tablet (500 mg total) by mouth 2 (two) times daily. 14 tablet 0   . propranolol (INDERAL) 40 MG tablet Take 40 mg by mouth 2 (two) times daily.   04/27/2017    Review of Systems  Constitutional: Negative.   Gastrointestinal: Positive for nausea. Negative for abdominal pain, constipation, diarrhea and vomiting.  Genitourinary: Positive for vaginal discharge. Negative for dysuria and vaginal  bleeding.  Musculoskeletal: Positive for back pain.   Physical Exam   Blood pressure 138/84, pulse 75, temperature 98 F (36.7 C), temperature source Oral, resp. rate 16, weight 222 lb 8 oz (100.9 kg), last menstrual period 03/21/2017, unknown if currently breastfeeding.  Physical Exam  Nursing note and vitals reviewed. Constitutional: She is oriented to person, place, and time. She appears well-developed and well-nourished. No distress.  HENT:  Head: Normocephalic and atraumatic.  Eyes: Conjunctivae are normal. Right eye exhibits no discharge. Left eye exhibits no discharge. No scleral icterus.  Neck: Normal range of motion.  Respiratory: Effort normal. No respiratory distress.  GI: Soft. Bowel sounds are normal. There is no tenderness. There is no CVA tenderness.  Genitourinary:  Genitourinary Comments: Dilation: Closed Exam by:: Estanislado SpireE. Klaus Casteneda NP   Musculoskeletal:       Lumbar back: She exhibits no tenderness.  Neurological: She is alert and oriented to person, place, and time.  Skin: Skin is warm and dry. She is not diaphoretic.  Psychiatric: She has a normal mood and affect. Her behavior is normal. Judgment and thought content normal.    MAU Course  Procedures Results for orders placed or performed during the hospital encounter of 05/27/17 (from the past 24 hour(s))  Urinalysis, Routine w reflex microscopic     Status: Abnormal  Collection Time: 05/27/17  8:45 AM  Result Value Ref Range   Color, Urine YELLOW YELLOW   APPearance CLOUDY (A) CLEAR   Specific Gravity, Urine 1.028 1.005 - 1.030   pH 6.0 5.0 - 8.0   Glucose, UA NEGATIVE NEGATIVE mg/dL   Hgb urine dipstick NEGATIVE NEGATIVE   Bilirubin Urine NEGATIVE NEGATIVE   Ketones, ur NEGATIVE NEGATIVE mg/dL   Protein, ur 30 (A) NEGATIVE mg/dL   Nitrite NEGATIVE NEGATIVE   Leukocytes, UA NEGATIVE NEGATIVE   RBC / HPF 0-5 0 - 5 RBC/hpf   WBC, UA 0-5 0 - 5 WBC/hpf   Bacteria, UA RARE (A) NONE SEEN   Squamous Epithelial  / LPF 6-30 (A) NONE SEEN   Mucus PRESENT   Wet prep, genital     Status: None   Collection Time: 05/27/17  9:35 AM  Result Value Ref Range   Yeast Wet Prep HPF POC NONE SEEN NONE SEEN   Trich, Wet Prep NONE SEEN NONE SEEN   Clue Cells Wet Prep HPF POC NONE SEEN NONE SEEN   WBC, Wet Prep HPF POC NONE SEEN NONE SEEN   Sperm NONE SEEN     MDM Informal bedside ultrasound shows IUP with cardiac activity Cervix closed/thick GC/CT & wet pre collected Tylenol 1 gm PO with some relief in symptoms  Assessment and Plan  A: 1. Low back pain during pregnancy in first trimester   2. [redacted] weeks gestation of pregnancy    P: Discharge home Discussed reasons to return to MAU Keep follow up appointment with OB/PCP  GC/CT pending   Judeth Hornrin Lowella Kindley 05/27/2017, 9:14 AM

## 2017-05-27 NOTE — MAU Note (Signed)
Low back pain, has been going on for some time, just been putting off being seen. Ongoing nausea.  No bleeding.  Does have d/c; no irritation or itching.

## 2017-05-27 NOTE — Discharge Instructions (Signed)
Back Pain in Pregnancy Back pain during pregnancy is common. Back pain may be caused by several factors that are related to changes during your pregnancy. Follow these instructions at home: Managing pain, stiffness, and swelling  If directed, apply ice for sudden (acute) back pain. ? Put ice in a plastic bag. ? Place a towel between your skin and the bag. ? Leave the ice on for 20 minutes, 2-3 times per day.  If directed, apply heat to the affected area before you exercise: ? Place a towel between your skin and the heat pack or heating pad. ? Leave the heat on for 20-30 minutes. ? Remove the heat if your skin turns bright red. This is especially important if you are unable to feel pain, heat, or cold. You may have a greater risk of getting burned. Activity  Exercise as told by your health care provider. Exercising is the best way to prevent or manage back pain.  Listen to your body when lifting. If lifting hurts, ask for help or bend your knees. This uses your leg muscles instead of your back muscles.  Squat down when picking up something from the floor. Do not bend over.  Only use bed rest as told by your health care provider. Bed rest should only be used for the most severe episodes of back pain. Standing, Sitting, and Lying Down  Do not stand in one place for long periods of time.  Use good posture when sitting. Make sure your head rests over your shoulders and is not hanging forward. Use a pillow on your lower back if necessary.  Try sleeping on your side, preferably the left side, with a pillow or two between your legs. If you are sore after a night's rest, your bed may be too soft. A firm mattress may provide more support for your back during pregnancy. General instructions  Do not wear high heels.  Eat a healthy diet. Try to gain weight within your health care provider's recommendations.  Use a maternity girdle, elastic sling, or back brace as told by your health care  provider.  Take over-the-counter and prescription medicines only as told by your health care provider.  Keep all follow-up visits as told by your health care provider. This is important. This includes any visits with any specialists, such as a physical therapist. Contact a health care provider if:  Your back pain interferes with your daily activities.  You have increasing pain in other parts of your body. Get help right away if:  You develop numbness, tingling, weakness, or problems with the use of your arms or legs.  You develop severe back pain that is not controlled with medicine.  You have a sudden change in bowel or bladder control.  You develop shortness of breath, dizziness, or you faint.  You develop nausea, vomiting, or sweating.  You have back pain that is a rhythmic, cramping pain similar to labor pains. Labor pain is usually 1-2 minutes apart, lasts for about 1 minute, and involves a bearing down feeling or pressure in your pelvis.  You have back pain and your water breaks or you have vaginal bleeding.  You have back pain or numbness that travels down your leg.  Your back pain developed after you fell.  You develop pain on one side of your back.  You see blood in your urine.  You develop skin blisters in the area of your back pain. This information is not intended to replace advice given to you   by your health care provider. Make sure you discuss any questions you have with your health care provider. Document Released: 09/25/2005 Document Revised: 11/23/2015 Document Reviewed: 03/01/2015 Elsevier Interactive Patient Education  2018 [employer]vier Inc.  

## 2017-05-28 LAB — GC/CHLAMYDIA PROBE AMP (~~LOC~~) NOT AT ARMC
Chlamydia: NEGATIVE
NEISSERIA GONORRHEA: NEGATIVE

## 2017-06-26 ENCOUNTER — Ambulatory Visit (HOSPITAL_COMMUNITY)
Admission: RE | Admit: 2017-06-26 | Discharge: 2017-06-26 | Disposition: A | Discharge status: Home / Self Care | Payer: Medicaid Other | Source: Ambulatory Visit | Attending: Nurse Practitioner | Admitting: Nurse Practitioner

## 2017-06-26 ENCOUNTER — Other Ambulatory Visit (HOSPITAL_COMMUNITY): Payer: Self-pay | Admitting: Nurse Practitioner

## 2017-06-26 ENCOUNTER — Encounter (HOSPITAL_COMMUNITY): Payer: Self-pay

## 2017-06-26 DIAGNOSIS — O10019 Pre-existing essential hypertension complicating pregnancy, unspecified trimester: Secondary | ICD-10-CM | POA: Insufficient documentation

## 2017-06-26 DIAGNOSIS — Z3A12 12 weeks gestation of pregnancy: Secondary | ICD-10-CM | POA: Diagnosis not present

## 2017-06-26 DIAGNOSIS — O99211 Obesity complicating pregnancy, first trimester: Secondary | ICD-10-CM | POA: Insufficient documentation

## 2017-06-26 DIAGNOSIS — Z3682 Encounter for antenatal screening for nuchal translucency: Secondary | ICD-10-CM

## 2017-06-26 DIAGNOSIS — Z3A13 13 weeks gestation of pregnancy: Secondary | ICD-10-CM

## 2017-06-26 DIAGNOSIS — O10919 Unspecified pre-existing hypertension complicating pregnancy, unspecified trimester: Secondary | ICD-10-CM

## 2017-06-26 NOTE — Addendum Note (Signed)
Encounter addended by: Levonne HubertStalter, Kennon Encinas M, RDMS, RVT on: 06/26/2017 11:29 AM  Actions taken: Imaging Exam ended

## 2017-06-28 ENCOUNTER — Other Ambulatory Visit: Payer: Self-pay

## 2017-07-01 NOTE — L&D Delivery Note (Signed)
Patient: Laura Downs MRN: [id number]  GBS status: negative  Patient is a 32 y.o. now [id number] s/p NSVD at 6938w0d, who [facility] S/p IOL with FB and oxytocin, AROM 5h 7669m prior to delivery with meconium-stained fluid.   Delivery Note At 9:25 PM a viable female was delivered via Vaginal, Vacuum (Extractor) (Presentation: vertex; ROA).  APGAR: 8, 9; weight pending.   Placenta status: intact, 3-veseel.  Cord: 3-vessel  Called into the room d/t prolonged deceleration to the 70s. SVE 9.5cm. FSE was applied, FHT still in the 70s despite repositioning, O2 supplementation, IVF bolus and Pitocin turned off.  Mother was instructed to push while I reduced her cervix. Cervix reduced. Dr. Jolayne Pantheronstant called into the room, and neonatal team called for possible VAVD.  A high vacuum was attempted. Delivery not successful after 2 pulls, but FHT recovered back to the 130s. Mother just instructed to continue to push. Fetal head position at this time OP. Mother pushed several times, but still not descending well. Ctx were >5 minutes apart with Pitocin off. Pitocin turned back on. I attempted to help head rotate with next contraction, and fetal head had good descent to 2+ station, followed by deceleration to the 60's w/o spontaneous recovery. Vacuum assist attempted again. 2 pulls administered with good descent, vacuum not working well, and while waiting to get a new one, mother instructed to continue pushing, and she successfully delivered infant.  Cord clamped and cut immediately and infant passed to awaiting neonatal team. Placenta delivered spontaneously with gentle cord traction. Fundus firm with massage and Pitocin. Perineum inspected and found to have no lacerations.  Anesthesia:  Epidural Episiotomy: None Lacerations: None Suture Repair: none Est. Blood Loss (mL): 300  Mom to postpartum.  Baby to Couplet care / Skin to Skin.  Raynelle FanningJulie P. Owain Eckerman, MD OB Fellow 01/03/18, 10:30 PM

## 2017-07-14 LAB — GLUCOSE, 1 HOUR GESTATIONAL
DRUG SCREEN, URINE: NEGATIVE
GLUCOSE 1 HOUR GTT: 110 (ref ?–200)
URINE CULTURE, OB: NEGATIVE

## 2017-07-14 LAB — OB RESULTS CONSOLE RPR: RPR: NONREACTIVE

## 2017-07-14 LAB — OB RESULTS CONSOLE ANTIBODY SCREEN: Antibody Screen: NEGATIVE

## 2017-07-14 LAB — OB RESULTS CONSOLE ABO/RH: RH TYPE: POSITIVE

## 2017-07-14 LAB — OB RESULTS CONSOLE HEPATITIS B SURFACE ANTIGEN: HEP B S AG: NEGATIVE

## 2017-07-14 LAB — OB RESULTS CONSOLE RUBELLA ANTIBODY, IGM: RUBELLA: IMMUNE

## 2017-07-25 ENCOUNTER — Encounter: Payer: Self-pay | Admitting: *Deleted

## 2017-07-28 ENCOUNTER — Encounter: Payer: Self-pay | Admitting: *Deleted

## 2017-07-28 ENCOUNTER — Ambulatory Visit (INDEPENDENT_AMBULATORY_CARE_PROVIDER_SITE_OTHER): Payer: Medicaid Other | Admitting: Family Medicine

## 2017-07-28 ENCOUNTER — Encounter: Payer: Self-pay | Admitting: Family Medicine

## 2017-07-28 VITALS — BP 121/73 | HR 97 | Wt 223.5 lb

## 2017-07-28 DIAGNOSIS — O10912 Unspecified pre-existing hypertension complicating pregnancy, second trimester: Secondary | ICD-10-CM

## 2017-07-28 DIAGNOSIS — F32A Depression, unspecified: Secondary | ICD-10-CM

## 2017-07-28 DIAGNOSIS — O0992 Supervision of high risk pregnancy, unspecified, second trimester: Secondary | ICD-10-CM

## 2017-07-28 DIAGNOSIS — O0990 Supervision of high risk pregnancy, unspecified, unspecified trimester: Secondary | ICD-10-CM

## 2017-07-28 DIAGNOSIS — O99340 Other mental disorders complicating pregnancy, unspecified trimester: Secondary | ICD-10-CM

## 2017-07-28 DIAGNOSIS — G43909 Migraine, unspecified, not intractable, without status migrainosus: Secondary | ICD-10-CM | POA: Insufficient documentation

## 2017-07-28 DIAGNOSIS — O10919 Unspecified pre-existing hypertension complicating pregnancy, unspecified trimester: Secondary | ICD-10-CM

## 2017-07-28 DIAGNOSIS — F329 Major depressive disorder, single episode, unspecified: Secondary | ICD-10-CM

## 2017-07-28 LAB — POCT URINALYSIS DIP (DEVICE)
GLUCOSE, UA: NEGATIVE mg/dL
HGB URINE DIPSTICK: NEGATIVE
LEUKOCYTES UA: NEGATIVE
NITRITE: NEGATIVE
Protein, ur: 30 mg/dL — AB
Specific Gravity, Urine: 1.025 (ref 1.005–1.030)
UROBILINOGEN UA: 1 mg/dL (ref 0.0–1.0)
pH: 6.5 (ref 5.0–8.0)

## 2017-07-28 NOTE — Progress Notes (Signed)
[facility] Sent MyChart text to sign up for MyChart. Signed up for Babys[facility]s app only. Discussed Weight gain today. PMH form completed.

## 2017-07-28 NOTE — Progress Notes (Signed)
  Subjective:  Laura Downs is a [id number] 81w2dbeing seen today for her first obstetrical visit.  Her obstetrical history is significant for chtn, h/o GHTN. Patient does intend to breast feed. Pregnancy history fully reviewed.  Patient reports no complaints.  BP 121/73   Pulse 97   Wt 223 lb 8 oz (101.4 kg)   LMP 03/21/2017   BMI 37.19 kg/m   HISTORY: OB History  Gravida Para Term Preterm AB Living  _0 0 0 3  SAB TAB Ectopic Multiple Live Births  0 0 0 0 3    # Outcome Date GA Lbr Len/2nd Weight Sex Delivery Anes PTL Lv  4 Current           3 Term 11/06/15 398w5d1:05 / 00:03 7 lb 3.5 oz (3.274 kg) F Vag-Spont None  LIV     Birth Comments: Gestational Hypertension at 38 weeks  2 Term 03/23/08 3963w5d lb 5 oz (2.863 kg) F Vag-Spont EPI  LIV  1 Term 12/19/04   7 lb (3.175 kg) M Vag-Spont EPI  LIV      Past Medical History:  Diagnosis Date  . Depression    current pregnancy  . Hypertension   . Vaginal Pap smear, abnormal     Past Surgical History:  Procedure Laterality Date  . COLPOSCOPY    . LEEP  2008   paps have been normal since.    History reviewed. No pertinent family history.   Exam    Uterus:     Pelvic Exam:   System:     Skin: normal coloration and turgor, no rashes    Neurologic: gait normal; reflexes normal and symmetric   Extremities: normal strength, tone, and muscle mass, no deformities, no erythema, induration, or nodules   HEENT PERRLA, extra ocular movement intact and sclera clear, anicteric   Mouth/Teeth mucous membranes moist, pharynx normal without lesions   Neck supple and no masses   Cardiovascular: regular rate and rhythm, no murmurs or gallops   Respiratory:  appears well, vitals normal, no respiratory distress, acyanotic, normal RR, ear and throat exam is normal, neck free of mass or lymphadenopathy, chest clear, no wheezing, crepitations, rhonchi, normal symmetric air entry   Abdomen: soft, non-tender; bowel sounds normal; no  masses,  no organomegaly          Assessment:    Pregnancy: [id number] Active Problem List   Diagnosis Date Noted  . Supervision of high risk pregnancy, antepartum 07/28/2017  . Chronic hypertension during pregnancy, antepartum 07/28/2017  . Migraines 07/28/2017  . Depression       Plan:   1. Supervision of high risk pregnancy, antepartum AFP today  - Enroll Patient in Babyscripts - [place] OB DETAIL +14 WK; Future - AFP, Serum, Open Spina Bifida - Protein / creatinine ratio, urine - Comp Met (CMET)  2. Chronic hypertension during pregnancy, antepartum CMP and UP:C ASA 9m75mscussed serial US, Koreatenatal testing  - Enroll Patient in Babyscripts - [place] OB DETAIL +14 WK; Future - AFP, Serum, Open Spina Bifida - Protein / creatinine ratio, urine - Comp Met (CMET)  3. Migraine without status migrainosus, not intractable, unspecified migraine type Tylenol     Problem list reviewed and updated. 75% of 30 min visit spent on counseling and coordination of care.     JacoTruett Mainland8/2019

## 2017-07-29 ENCOUNTER — Encounter: Payer: Self-pay | Admitting: *Deleted

## 2017-07-30 LAB — COMPREHENSIVE METABOLIC PANEL
A/G RATIO: 1.1 — AB (ref 1.2–2.2)
ALBUMIN: 3.6 g/dL (ref 3.5–5.5)
ALK PHOS: 53 IU/L (ref 39–117)
ALT: 26 IU/L (ref 0–32)
AST: 18 IU/L (ref 0–40)
BUN / CREAT RATIO: 8 — AB (ref 9–23)
BUN: 5 mg/dL — ABNORMAL LOW (ref 6–20)
CHLORIDE: 102 mmol/L (ref 96–106)
CO2: 23 mmol/L (ref 20–29)
Calcium: 9.1 mg/dL (ref 8.7–10.2)
Creatinine, Ser: 0.59 mg/dL (ref 0.57–1.00)
GFR calc non Af Amer: 123 mL/min/{1.73_m2} (ref >59)
GFR, EST AFRICAN AMERICAN: 141 mL/min/{1.73_m2} (ref >59)
GLOBULIN, TOTAL: 3.2 g/dL (ref 1.5–4.5)
Glucose: 84 mg/dL (ref 65–99)
Potassium: 3.4 mmol/L — ABNORMAL LOW (ref 3.5–5.2)
SODIUM: 141 mmol/L (ref 134–144)
TOTAL PROTEIN: 6.8 g/dL (ref 6.0–8.5)

## 2017-07-30 LAB — AFP, SERUM, OPEN SPINA BIFIDA
AFP MOM: 0.77
AFP VALUE AFPOSL: 25.5 ng/mL
Gest. Age on Collection Date: 17.3 weeks
MATERNAL AGE AT EDD: 31.4 a
OSBR RISK 1 IN: 10000
TEST RESULTS AFP: NEGATIVE
WEIGHT: 223 [lb_av]

## 2017-07-30 LAB — PROTEIN / CREATININE RATIO, URINE
CREATININE, UR: 327.5 mg/dL
Protein, Ur: 27.9 mg/dL
Protein/Creat Ratio: 85 mg/g creat (ref 0–200)

## 2017-08-07 ENCOUNTER — Encounter (HOSPITAL_COMMUNITY): Payer: Self-pay

## 2017-08-07 ENCOUNTER — Other Ambulatory Visit: Payer: Self-pay

## 2017-08-07 ENCOUNTER — Inpatient Hospital Stay (HOSPITAL_COMMUNITY)
Admission: AD | Admit: 2017-08-07 | Discharge: 2017-08-07 | Disposition: A | Discharge status: Home / Self Care | Payer: Medicaid Other | Source: Ambulatory Visit | Attending: Obstetrics and Gynecology | Admitting: Obstetrics and Gynecology

## 2017-08-07 DIAGNOSIS — O99342 Other mental disorders complicating pregnancy, second trimester: Secondary | ICD-10-CM | POA: Insufficient documentation

## 2017-08-07 DIAGNOSIS — R51 Headache: Secondary | ICD-10-CM | POA: Diagnosis present

## 2017-08-07 DIAGNOSIS — M7918 Myalgia, other site: Secondary | ICD-10-CM | POA: Diagnosis not present

## 2017-08-07 DIAGNOSIS — O26892 Other specified pregnancy related conditions, second trimester: Secondary | ICD-10-CM

## 2017-08-07 DIAGNOSIS — R519 Headache, unspecified: Secondary | ICD-10-CM

## 2017-08-07 DIAGNOSIS — O9989 Other specified diseases and conditions complicating pregnancy, childbirth and the puerperium: Secondary | ICD-10-CM | POA: Diagnosis not present

## 2017-08-07 DIAGNOSIS — M549 Dorsalgia, unspecified: Secondary | ICD-10-CM

## 2017-08-07 DIAGNOSIS — O10919 Unspecified pre-existing hypertension complicating pregnancy, unspecified trimester: Secondary | ICD-10-CM

## 2017-08-07 DIAGNOSIS — O162 Unspecified maternal hypertension, second trimester: Secondary | ICD-10-CM | POA: Diagnosis not present

## 2017-08-07 DIAGNOSIS — O0992 Supervision of high risk pregnancy, unspecified, second trimester: Secondary | ICD-10-CM | POA: Insufficient documentation

## 2017-08-07 DIAGNOSIS — Z566 Other physical and mental strain related to work: Secondary | ICD-10-CM

## 2017-08-07 DIAGNOSIS — O99891 Other specified diseases and conditions complicating pregnancy: Secondary | ICD-10-CM

## 2017-08-07 DIAGNOSIS — Z7982 Long term (current) use of aspirin: Secondary | ICD-10-CM | POA: Diagnosis not present

## 2017-08-07 DIAGNOSIS — Z3A18 18 weeks gestation of pregnancy: Secondary | ICD-10-CM | POA: Insufficient documentation

## 2017-08-07 DIAGNOSIS — F329 Major depressive disorder, single episode, unspecified: Secondary | ICD-10-CM | POA: Insufficient documentation

## 2017-08-07 DIAGNOSIS — O0990 Supervision of high risk pregnancy, unspecified, unspecified trimester: Secondary | ICD-10-CM

## 2017-08-07 LAB — URINALYSIS, ROUTINE W REFLEX MICROSCOPIC
Bilirubin Urine: NEGATIVE
GLUCOSE, UA: NEGATIVE mg/dL
Hgb urine dipstick: NEGATIVE
KETONES UR: NEGATIVE mg/dL
Leukocytes, UA: NEGATIVE
Nitrite: NEGATIVE
PROTEIN: 30 mg/dL — AB
Specific Gravity, Urine: 1.028 (ref 1.005–1.030)
pH: 6 (ref 5.0–8.0)

## 2017-08-07 MED ORDER — CYCLOBENZAPRINE HCL 10 MG PO TABS: 5.0000 mg | ORAL_TABLET | Freq: Three times a day (TID) | ORAL | 0 refills | Status: DC | PRN

## 2017-08-07 NOTE — MAU Note (Signed)
Pt states she has had intermittent back pain for the last week. She is rating the pain 8/10.   She also has a headache and is rating the pain 6/10 constant.

## 2017-08-07 NOTE — MAU Provider Note (Signed)
Chief Complaint: Headache and Back Pain   First Provider Initiated Contact with Patient 08/07/17 1602      SUBJECTIVE HPI: Laura Downs is a 32 y.o. G4P3003 at [redacted]w[redacted]d by LMP who presents to maternity admissions reporting pain to the right of her spine and headache, both starting 1 week ago. She reports the pain is intermittent pressure pain that is worsening since onset. She reports she cannot tolerate this pain. It is not associated with any position, nothing makes it better or worse. 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 She reports the job is stressful, she rarely gets breaks, and she leaves work crying often.  She reports feeling depressed lately. She denies any thoughts of harming herself or others. She reports she never felt depressed before this job but she is just overwhelmed.  There are no other associated symptoms. She has not tried any other treatments. She denies vaginal bleeding, vaginal itching/burning, urinary symptoms, dizziness, n/v, or fever/chills.     HPI  Past Medical History:  Diagnosis Date  . Depression    current pregnancy  . Hypertension   . Vaginal Pap smear, abnormal    Past Surgical History:  Procedure Laterality Date  . COLPOSCOPY    . LEEP  2008   paps have been normal since.   Social History   Socioeconomic History  . Marital status: Single    Spouse name: Not on file  . Number of children: Not on file  . Years of education: Not on file  . Highest education level: Not on file  Social Needs  . Financial resource strain: Not on file  . Food insecurity - worry: Not on file  . Food insecurity - inability: Not on file  . Transportation needs - medical: Not on file  . Transportation needs - non-medical: Not on file  Occupational History  . Not on file  Tobacco Use  . Smoking status: Never Smoker  . Smokeless tobacco: Never Used  Substance and Sexual Activity  . Alcohol use: No  . Drug use: No  . Sexual activity: Not  Currently    Partners: Male    Birth control/protection: None  Other Topics Concern  . Not on file  Social History Narrative  . Not on file   No current facility-administered medications on file prior to encounter.    Current Outpatient Medications on File Prior to Encounter  Medication Sig Dispense Refill  . acetaminophen (TYLENOL) 500 MG tablet Take 500 mg by mouth every 6 (six) hours as needed for mild pain or headache.    . Prenatal Vit-Fe Phos-FA-Omega (VITAFOL GUMMIES) 3.33-0.333-34.8 MG CHEW Chew 5 tablets by mouth daily.   1  . aspirin 81 MG chewable tablet Chew 81 mg by mouth daily.     No Known Allergies  ROS:  Review of Systems  Constitutional: Negative for chills, fatigue and fever.  Respiratory: Negative for shortness of breath.   Cardiovascular: Negative for chest pain.  Genitourinary: Positive for pelvic pain. Negative for difficulty urinating, dysuria, flank pain, vaginal bleeding, vaginal discharge and vaginal pain.  Neurological: Negative for dizziness and headaches.  Psychiatric/Behavioral: Negative.      I have reviewed patient's Past Medical Hx, Surgical Hx, Family Hx, Social Hx, medications and allergies.   Physical Exam   Patient Vitals for the past 24 hrs:  BP Temp Temp src Pulse Resp SpO2 Height Weight  08/07/17 1819 128/74 - - 80 - - - -  08/07/17 1522 125/79 98.2 F (36.8 C) Oral 91 16 98 % 5\' 5"  (1.651 m) 224 lb 12 oz (101.9 kg)   Constitutional: Well-developed, well-nourished female in no acute distress.  Cardiovascular: normal rate Respiratory: normal effort GI: Abd soft, non-tender. Pos BS x 4 MS: Extremities nontender, no edema, normal ROM Neurologic: Alert and oriented x 4.  GU: Neg CVAT.  PELVIC EXAM: Cervix pink, visually closed, without lesion, scant white creamy discharge, vaginal walls and external genitalia normal Bimanual exam: Cervix 0/long/high, firm, anterior, neg CMT, uterus nontender, nonenlarged, adnexa without tenderness,  enlargement, or mass  FHT 153 by doppler  LAB RESULTS Results for orders placed or performed during the hospital encounter of 08/07/17 (from the past 24 hour(s))  Urinalysis, Routine w reflex microscopic     Status: Abnormal   Collection Time: 08/07/17  3:08 PM  Result Value Ref Range   Color, Urine YELLOW YELLOW   APPearance CLOUDY (A) CLEAR   Specific Gravity, Urine 1.028 1.005 - 1.030   pH 6.0 5.0 - 8.0   Glucose, UA NEGATIVE NEGATIVE mg/dL   Hgb urine dipstick NEGATIVE NEGATIVE   Bilirubin Urine NEGATIVE NEGATIVE   Ketones, ur NEGATIVE NEGATIVE mg/dL   Protein, ur 30 (A) NEGATIVE mg/dL   Nitrite NEGATIVE NEGATIVE   Leukocytes, UA NEGATIVE NEGATIVE   RBC / HPF 0-5 0 - 5 RBC/hpf   WBC, UA 0-5 0 - 5 WBC/hpf   Bacteria, UA RARE (A) NONE SEEN   Squamous Epithelial / LPF TOO NUMEROUS TO COUNT (A) NONE SEEN   Mucus PRESENT     A/Positive/-- (01/14 0000)  IMAGING No results found.  MAU Management/MDM: Pt without signs of preterm labor with closed cervix.  Pain is likely musculoskeletal from her job.  The job has long hours with few breaks and she is unable to eat and drink normally and is having signs of depression related to her work.  Letter provided to restrict pt hours so she can continue to work during her pregnancy.  Rx for Flexeril PRN for back pain, also try rest/ice/heat/warm bath/Tylenol.  Brainstormed with pt about ways to get better lumbar support/ergonomic seating on the bus (buy pillow online, ask to borrow from other bus drivers, etc).  Pt given contact info for behaviorial health to follow up for counseling and to follow up with  Asher Muir in [facility] office.  Pt discharged with strict preterm labor precautions.  ASSESSMENT 1. Back pain affecting pregnancy in second trimester   2. Supervision of high risk pregnancy, antepartum   3. Chronic hypertension during pregnancy, antepartum   4. Musculoskeletal pain   5. Headache in pregnancy, antepartum, second trimester   6.  Stress at work     PLAN Discharge home Allergies as of 08/07/2017   No Known Allergies     Medication List    TAKE these medications   acetaminophen 500 MG tablet Commonly known as:  TYLENOL Take 500 mg by mouth every 6 (six) hours as needed for mild pain or headache.   aspirin 81 MG chewable tablet Chew 81 mg by mouth daily.   cyclobenzaprine 10 MG tablet Commonly known as:  FLEXERIL Take 0.5-1 tablets (5-10 mg total) by mouth 3 (three) times daily as needed for muscle spasms.   VITAFOL GUMMIES 3.33-0.333-34.8 MG Chew Chew 5 tablets by mouth daily.      Follow-up Information    [facility]  Follow up.   Why:  Return to MAU as needed for emergencies. Contact  information: [address] [place] [phone]          Sharen CounterLisa Leftwich-Kirby Certified Nurse-Midwife 08/07/2017  8:29 PM

## 2017-08-11 ENCOUNTER — Other Ambulatory Visit: Payer: Self-pay | Admitting: Family Medicine

## 2017-08-11 ENCOUNTER — Ambulatory Visit (HOSPITAL_COMMUNITY)
Admission: RE | Admit: 2017-08-11 | Discharge: 2017-08-11 | Disposition: A | Discharge status: Home / Self Care | Payer: Medicaid Other | Source: Ambulatory Visit | Attending: Family Medicine | Admitting: Family Medicine

## 2017-08-11 DIAGNOSIS — Z3689 Encounter for other specified antenatal screening: Secondary | ICD-10-CM | POA: Diagnosis present

## 2017-08-11 DIAGNOSIS — O99212 Obesity complicating pregnancy, second trimester: Secondary | ICD-10-CM | POA: Diagnosis not present

## 2017-08-11 DIAGNOSIS — O0990 Supervision of high risk pregnancy, unspecified, unspecified trimester: Secondary | ICD-10-CM | POA: Diagnosis present

## 2017-08-11 DIAGNOSIS — O10919 Unspecified pre-existing hypertension complicating pregnancy, unspecified trimester: Secondary | ICD-10-CM

## 2017-08-11 DIAGNOSIS — E669 Obesity, unspecified: Secondary | ICD-10-CM | POA: Diagnosis not present

## 2017-08-11 DIAGNOSIS — Z3A19 19 weeks gestation of pregnancy: Secondary | ICD-10-CM | POA: Insufficient documentation

## 2017-08-22 ENCOUNTER — Telehealth: Payer: Self-pay | Admitting: Advanced Practice Midwife

## 2017-08-22 NOTE — Telephone Encounter (Signed)
Patient called to say she [facility] to [facility] about a letter that she wrote for her. You may call her back at the number listed in Epic.

## 2017-08-26 ENCOUNTER — Other Ambulatory Visit (HOSPITAL_COMMUNITY)
Admission: RE | Admit: 2017-08-26 | Discharge: 2017-08-26 | Disposition: A | Discharge status: Home / Self Care | Payer: Medicaid Other | Source: Ambulatory Visit | Attending: Family Medicine | Admitting: Family Medicine

## 2017-08-26 ENCOUNTER — Ambulatory Visit (INDEPENDENT_AMBULATORY_CARE_PROVIDER_SITE_OTHER): Payer: Medicaid Other | Admitting: Clinical

## 2017-08-26 ENCOUNTER — Ambulatory Visit: Payer: Medicaid Other | Admitting: Family Medicine

## 2017-08-26 VITALS — BP 111/64 | HR 86 | Wt 225.7 lb

## 2017-08-26 DIAGNOSIS — O99340 Other mental disorders complicating pregnancy, unspecified trimester: Secondary | ICD-10-CM

## 2017-08-26 DIAGNOSIS — O0990 Supervision of high risk pregnancy, unspecified, unspecified trimester: Secondary | ICD-10-CM | POA: Insufficient documentation

## 2017-08-26 DIAGNOSIS — F329 Major depressive disorder, single episode, unspecified: Secondary | ICD-10-CM

## 2017-08-26 DIAGNOSIS — F4323 Adjustment disorder with mixed anxiety and depressed mood: Secondary | ICD-10-CM | POA: Diagnosis not present

## 2017-08-26 DIAGNOSIS — O26892 Other specified pregnancy related conditions, second trimester: Secondary | ICD-10-CM

## 2017-08-26 DIAGNOSIS — O10919 Unspecified pre-existing hypertension complicating pregnancy, unspecified trimester: Secondary | ICD-10-CM

## 2017-08-26 DIAGNOSIS — O26899 Other specified pregnancy related conditions, unspecified trimester: Secondary | ICD-10-CM

## 2017-08-26 DIAGNOSIS — N898 Other specified noninflammatory disorders of vagina: Secondary | ICD-10-CM | POA: Insufficient documentation

## 2017-08-26 MED ORDER — SERTRALINE HCL 25 MG PO TABS: 25.0000 mg | ORAL_TABLET | Freq: Every day | ORAL | 2 refills | Status: DC

## 2017-08-26 NOTE — BH Specialist Note (Signed)
Integrated Behavioral Health Initial Visit  MRN: [id number] Name: Laura Downs  Number of Integrated Behavioral Health Clinician visits:: 1/6 Session Start time: 9:28   Session End time: 9:57 Total time: 30 minutes  Type of Service: Integrated Behavioral Health- Individual/Family Interpretor:No. Interpretor Name and Language: n/a   Warm Hand Off Completed.       SUBJECTIVE: Laura Downs is a 32 y.o. female accompanied by n/a Patient was referred by Dr Rachelle HoraMoss for depression. Patient reports the following symptoms/concerns: Pt. States her primary symptoms are depression, oversleeping, and lack of motivation and life stress. Pt. Open to learning coping strategies. Duration of problem: Current Pregnancy; Severity of problem: moderate  OBJECTIVE: Mood: Depressed and Affect: Appropriate Risk of harm to self or others: No plan to harm self or others   LIFE CONTEXT: Family and Social: Pt. Lives with her three children (12, 9, 1). School/Work: Currently unemployed. Self-Care: Excessive sleep; recognizing a greater need for self care. Life Changes: Current pregnancy and job loss.  GOALS ADDRESSED: Patient will: 1. Reduce symptoms of: depression and stress 2. Increase knowledge and/or ability of: self-management skills  3. Demonstrate ability to: Increase healthy adjustment to current life circumstances  INTERVENTIONS: Interventions utilized: Mindfulness or Management consultantelaxation Training and Psychoeducation and/or Health Education  Standardized Assessments completed: GAD-7 and PHQ 9  ASSESSMENT: Patient currently experiencing Adjustment disorder with depressed and anxious mood.   Patient may benefit from psychoeducation and brief therapeutic interventions regarding coping with symptoms of Depression and anxiety. Marland Kitchen.  PLAN: 1. Follow up with behavioral health clinician on : two weeks ([facility] 2. Behavioral recommendations:  -CALM relaxation  breathing exercise daily.  -Consider apps for additional self coping.  -Read educational materials regarding coping with symptoms of Depression and anxiety  3. Referral(s): Integrated Behavioral Health Services (In Clinic) 4. "From scale of 1-10, how likely are you to follow plan?": 9  Rae LipsJamie C McMannes, LCSW  Depression screen [facility] 2/9 08/26/2017 07/28/2017 04/27/2015  Decreased Interest 3 0 0  Down, Depressed, Hopeless 3 0 0  PHQ - 2 Score 6 0 0  Altered sleeping 3 0 -  Tired, decreased energy 2 0 -  Change in appetite 2 0 -  Feeling bad or failure about yourself  1 0 -  Trouble concentrating 0 0 -  Moving slowly or fidgety/restless 0 0 -  Suicidal thoughts - 0 -  PHQ-9 Score 14 0 -   GAD 7 : Generalized Anxiety Score 08/26/2017 08/26/2017 07/28/2017  Nervous, Anxious, on Edge 1 0 0  Control/stop worrying 1 0 0  Worry too much - different things 1 0 0  Trouble relaxing 1 0 0  Restless 1 0 0  Easily annoyed or irritable 1 0 0  Afraid - awful might happen 1 0 0  Total GAD 7 Score 7 0 0

## 2017-08-26 NOTE — Patient Instructions (Addendum)
Exercise During Pregnancy For people of all ages, exercise is an important part of being healthy. Exercise improves heart and lung function and helps to maintain strength, flexibility, and a healthy body weight. Exercise also boosts energy levels and elevates mood. For most women, maintaining an exercise routine throughout pregnancy is recommended. It is only on rare occasions and with certain medical conditions or pregnancy complications that women may be asked to limit or avoid exercise during pregnancy. What are some other benefits to exercising during pregnancy? Along with maintaining strength and flexibility, exercising throughout pregnancy can help to:  Keep strength in muscles that are very important during labor and childbirth.  Decrease low back pain during pregnancy.  Decrease the risk of developing gestational diabetes mellitus (GDM).  Improve blood sugar (glucose) control for women who have GDM.  Decrease the risk of developing preeclampsia. This is a serious condition that causes high blood pressure along with other symptoms, such as swelling and headaches.  Decrease the risk of cesarean delivery.  Speed up the recovery after giving birth.  How often should I exercise? Unless your health care provider gives you different instructions, you should try to exercise on most days or all days of the week. In general, try to exercise with moderate intensity for about 150 minutes per week. This can be spread out across several days, such as exercising for 30 minutes per day on 5 days of each week. You can tell that you are exercising at a moderate intensity if you have a higher heart rate and faster breathing, but you are still able to hold a conversation. What types of moderate-intensity exercise are recommended during pregnancy? There are many types of exercise that are safe for you to do during pregnancy. Unless your health care provider gives you different instructions, do a variety of  exercises that safely increase your heart and breathing (cardiopulmonary) rates and help you to build and maintain muscle strength (strength training). You should always be able to talk in full sentences while exercising during pregnancy. Some examples of exercising that is safe to do during pregnancy include:  Brisk walking or hiking.  Swimming.  Water aerobics.  Riding a stationary bike.  Strength training.  Modified yoga or Pilates. Tell your instructor that you are pregnant. Avoid overstretching and avoid lying on your back for long periods of time.  Running or jogging. Only choose this type of exercise if: ? You ran or jogged regularly before your pregnancy. ? You can run or jog and still talk in complete sentences.  What types of exercise should I not do during pregnancy? Depending on your level of fitness and whether you exercised regularly before your pregnancy, you may be advised to limit vigorous-intensity exercise during your pregnancy. You can tell that you are exercising at a vigorous intensity if you are breathing much harder and faster and cannot hold a conversation while exercising. Some examples of exercising that you should avoid during pregnancy include:  Contact sports.  Activities that place you at risk for falling on or being hit in the belly, such as downhill skiing, water skiing, surfing, rock climbing, cycling, gymnastics, and horseback riding.  Scuba diving.  Sky diving.  Yoga or Pilates in a room that is heated to extreme temperatures ("hot yoga" or "hot Pilates").  Jogging or running, unless you ran or jogged regularly before your pregnancy. While jogging or running, you should always be able to talk in full sentences. Do not run or jog so vigorously   that you are unable to have a conversation.  If you are not used to exercising at elevation (more than 6,000 feet above sea level), do not do so during your pregnancy.  When should I avoid exercising  during pregnancy? Certain medical conditions can make it unsafe to exercise during pregnancy, or they may increase your risk of miscarriage or early labor and birth. Some of these conditions include:  Some types of heart disease.  Some types of lung disease.  Placenta previa. This is when the placenta partially or completely covers the opening of the uterus (cervix).  Frequent bleeding from the vagina during your pregnancy.  Incompetent cervix. This is when your cervix does not remain as tightly closed during pregnancy as it should.  Premature labor.  Ruptured membranes. This is when the protective sac (amniotic sac) opens up and amniotic fluid leaks from your vagina.  Severely low blood count (anemia).  Preeclampsia or pregnancy-caused high blood pressure.  Carrying more than one baby (multiple gestation) and having an additional risk of early labor.  Poorly controlled diabetes.  Being severely underweight or severely overweight.  Intrauterine growth restriction. This is when your baby's growth and development during pregnancy are slower than expected.  Other medical conditions. Ask your health care provider if any apply to you.  What else should I know about exercising during pregnancy? You should take these precautions while exercising during pregnancy:  Avoid overheating. ? Wear loose-fitting, breathable clothes. ? Do not exercise in very high temperatures.  Avoid dehydration. Drink enough water before, during, and after exercise to keep your urine clear or pale yellow.  Avoid overstretching. Because of hormone changes during pregnancy, it is easy to overstretch muscles, tendons, and ligaments during pregnancy.  Start slowly and ask your health care provider to recommend types of exercise that are safe for you, if exercising regularly is new for you.  Pregnancy is not a time for exercising to lose weight. When should I seek medical care? You should stop exercising  and call your health care provider if you have any unusual symptoms, such as:  Mild uterine contractions or abdominal cramping.  Dizziness that does not improve with rest.  When should I seek immediate medical care? You should stop exercising and call your local emergency services (911 in the [place] if you have any unusual symptoms, such as:  Sudden, severe pain in your low back or your belly.  Uterine contractions or abdominal cramping that do not improve with rest.  Chest pain.  Bleeding or fluid leaking from your vagina.  Shortness of breath.  This information is not intended to replace advice given to you by your health care provider. Make sure you discuss any questions you have with your health care provider. Document Released: 06/17/2005 Document Revised: 11/15/2015 Document Reviewed: 08/25/2014 Elsevier Interactive Patient Education  2018 [employer]  Perinatal Depression When a woman feels excessive sadness, anger, or anxiety during pregnancy or during the first 12 months after she gives birth, she has a condition called perinatal depression. Depression can interfere with work, school, relationships, and other everyday activities. If it is not managed properly, it can also cause problems in the mother and her baby. Sometimes, perinatal depression is left untreated because symptoms are thought to be normal mood swings during and right after pregnancy. If you have symptoms of depression, it is important to talk with your health care provider. What are the causes? The exact cause of this condition is not known. Hormonal changes during  and after pregnancy may play a role in causing perinatal depression. What increases the risk? You are more likely to develop this condition if:  You have a personal or family history of depression, anxiety, or mood disorders.  You experience a stressful life event during pregnancy, such as the death of a loved one.  You have a lot of regular  life stress.  You do not have support from family members or loved ones, or you are in an abusive relationship.  What are the signs or symptoms? Symptoms of this condition include:  Feeling sad or hopeless.  Feelings of guilt.  Feeling irritable or overwhelmed.  Changes in your appetite.  Lack of energy or motivation.  Sleep problems.  Difficulty concentrating or completing tasks.  Loss of interest in hobbies or relationships.  Headaches or stomach problems that do not go away.  How is this diagnosed? This condition is diagnosed based on a physical exam and mental evaluation. In some cases, your health care provider may use a depression screening tool. These tools include a list of questions that can help a health care provider diagnose depression. Your health care provider may refer you to a mental health expert who specializes in depression. How is this treated? This condition may be treated with:  Medicines. Your health care provider will only give you medicines that have been proven safe for pregnancy and breastfeeding.  Talk therapy with a mental health professional to help change your patterns of thinking (cognitive behavioral therapy).  Support groups.  Brain stimulation or light therapies.  Stress reduction therapies, such as mindfulness.  Follow these instructions at home: Lifestyle  Do not use any products that contain nicotine or tobacco, such as cigarettes and e-cigarettes. If you need help quitting, ask your health care provider.  Do not use alcohol when you are pregnant. After your baby is born, limit alcohol intake to no more than 1 drink a day. One drink equals 12 oz of beer, 5 oz of wine, or 1 oz of hard liquor.  Consider joining a support group for new mothers. Ask your health care provider for recommendations.  Take good care of yourself. Make sure you: ? Get plenty of sleep. If you are having trouble sleeping, talk with your health care  provider. ? Eat a healthy diet. This includes plenty of fruits and vegetables, whole grains, and lean proteins. ? Exercise regularly, as told by your health care provider. Ask your health care provider what exercises are safe for you. General instructions  Take over-the-counter and prescription medicines only as told by your health care provider.  Talk with your partner or family members about your feelings during pregnancy. Share any concerns or anxieties that you may have.  Ask for help with tasks or chores when you need it. Ask friends and family members to provide meals, watch your children, or help with cleaning.  Keep all follow-up visits as told by your health care provider. This is important. Contact a health care provider if:  You (or people close to you) notice that you have any symptoms of depression.  You have depression and your symptoms get worse.  You experience side effects from medicines, such as nausea or sleep problems. Get help right away if:  You feel like hurting yourself, your baby, or someone else. If you ever feel like you may hurt yourself or others, or have thoughts about taking your own life, get help right away. You can go to your nearest emergency department  or call:  Your local emergency services (911 in the [place]  A suicide crisis helpline, such as the National Suicide Prevention Lifeline at 603 823 0080. This is open 24 hours a day.  Summary  Perinatal depression is when a woman feels excessive sadness, anger, or anxiety during pregnancy or during the first 12 months after she gives birth.  If perinatal depression is not treated, it can lead to health problems for the mother and her baby.  This condition is treated with medicines, talk therapy, stress reduction therapies, or a combination of two or more treatments.  Talk with your partner or family members about your feelings. Do not be afraid to ask for help. This information is not intended  to replace advice given to you by your health care provider. Make sure you discuss any questions you have with your health care provider. Document Released: 08/14/2016 Document Revised: 08/14/2016 Document Reviewed: 08/14/2016 Elsevier Interactive Patient Education  [employer].

## 2017-08-26 NOTE — Progress Notes (Signed)
   PRENATAL VISIT NOTE  Subjective:  Laura Downs is a 32 y.o. [phone] at 7336w3d being seen today for ongoing prenatal care.  She is currently monitored for the following issues for this high-risk pregnancy and has Supervision of high risk pregnancy, antepartum; Chronic hypertension during pregnancy, antepartum; Depression; Migraines; and Vaginal discharge during pregnancy on their problem list.  Patient reports feeling depressed. She is tearful during exam and says she has been feeling sad. Denies SI currently. She says her back pain has improved since she is no longer working, wants to apply for pregnancy disability. She also admits to new vaginal discharge with an odor that started a couple days ago.  Contractions: Not present. Vag. Bleeding: None.  Movement: Present. Denies leaking of fluid.   The following portions of the patient's history were reviewed and updated as appropriate: allergies, current medications, past family history, past medical history, past social history, past surgical history and problem list. Problem list updated.  Objective:   Vitals:   08/26/17 0857  BP: 111/64  Pulse: 86  Weight: 102.4 kg (225 lb 11.2 oz)    Fetal Status: Fetal Heart Rate (bpm): 147 Fundal Height: 20 cm Movement: Present     General:  Alert, oriented and cooperative. Patient is in no acute distress.  Skin: Skin is warm and dry. No rash noted.   Cardiovascular: Normal heart rate noted  Respiratory: Normal respiratory effort, no problems with respiration noted  Abdomen: Soft, gravid, appropriate for gestational age.  Pain/Pressure: Absent     Pelvic: Cervical exam deferred        Extremities: Normal range of motion.  Edema: None  Mental Status:  Normal mood and affect. Normal behavior. Normal judgment and thought content.   Assessment and Plan:  Pregnancy: G4P3003 at 8136w3d  1. Supervision of high risk pregnancy, antepartum - US MFM OB FOLLOW UP; Future - Ambulatory referral to  Integrated Behavioral Health - Cervicovaginal ancillary only  2. Depression during pregnancy, antepartum Start zoloft 25 mg qday Advised on risks and benefits as well as side effects of medication Follow [facility] in 2 weeks [facility] patient today  3. Chronic hypertension during pregnancy, antepartum Continue daily aspirin 4 week follow up growth US and every 4 weeks thereafter  4. Vaginal discharge during pregnancy in second trimester Sample collected and labs pending   Please refer to After Visit Summary for other counseling recommendations.  Return in about 2 weeks (around 09/09/2017) for behavioral health follow up.   Rolm BookbinderAmber Neeva Trew, DO

## 2017-08-26 NOTE — Progress Notes (Signed)
Pt states has been nauseated off & on,pt also states has d/c with odor,thinks she may have a yeast infection

## 2017-08-27 LAB — CERVICOVAGINAL ANCILLARY ONLY
Bacterial vaginitis: NEGATIVE
CHLAMYDIA, DNA PROBE: NEGATIVE
Candida vaginitis: NEGATIVE
NEISSERIA GONORRHEA: NEGATIVE
Trichomonas: NEGATIVE

## 2017-09-03 ENCOUNTER — Telehealth: Payer: Self-pay | Admitting: *Deleted

## 2017-09-03 NOTE — Telephone Encounter (Signed)
Anistyn called back and left a message this am stating she wants to see if a nurse can call hr about the paperwork- states she got another fax number because they still haven't received the paperwork.

## 2017-09-03 NOTE — Telephone Encounter (Signed)
Kasie called 09/01/17 pm and left a voicemessage she wants to know if the paperwork for Dr. Craige CottaKirby is finished and sent.

## 2017-09-05 NOTE — Telephone Encounter (Signed)
I called Versa back and informed her it had been completed; she states she had spoke to someone earlier who said it was faxed . She states but the place it needs to go hasn't received it. I informed her I would transfer her call to registrars who assist with faxing the records. Call transferred to registrars.

## 2017-09-22 NOTE — BH Specialist Note (Signed)
Integrated Behavioral Health Follow Up Visit  MRN: [id number] Name: Laura Downs  Number of Integrated Behavioral Health Clinician visits: 2/6 Session Start time: 9:39  Session End time: 9:56 Total time: 20 minutes  Type of Service: Integrated Behavioral Health- Individual/Family Interpretor:No. Interpretor Name and Language: n/a  SUBJECTIVE: Laura Firemanlexis D Puello is a 32 y.o. female accompanied by n/a  Patient was referred by Dr Rachelle HoraMoss for depression. Patient reports the following symptoms/concerns: Pt states her primary concern today is an increase in feeling anxious when she stopped taking BH meds, and prefers coping with her symptoms by reading novels, spending time outdoors, and relaxation breathing exercises.  Duration of problem: Current pregnancy; Severity of problem: moderate  OBJECTIVE: Mood: Anxious and Affect: Appropriate Risk of harm to self or others: No plan to harm self or others   LIFE CONTEXT: Family and Social: Pt lives with her three children, ages 621,9,12  School/Work: Unemployed  Self-Care: Sleep is improving; reading novels for self-care Life Changes: Current pregnancy and job loss   GOALS ADDRESSED: Patient will: 1.  Reduce symptoms of: anxiety, depression and stress  2.  Increase knowledge and/or ability of: self-management skills   3.  Demonstrate ability to: Increase healthy adjustment to current life circumstances   INTERVENTIONS: Interventions utilized:  Solution-Focused Strategies Standardized Assessments completed: GAD-7 and PHQ 9   ASSESSMENT: Patient currently experiencing Adjustment disorder with depressed and anxious mood   Patient may benefit from continued brief therapeutic interventions regarding coping with symptoms of depression and anxiety.  PLAN: 1. Follow up with behavioral health clinician on : One month 2. Behavioral recommendations:  -Begin taking daily aspirin and prenatal vitamins, as recommended by medical provider  today -Continue reading novels, spending time outdoors, and relaxation breathing exercises; consider doing all 3 daily for optimal benefit 3. Referral(s): Integrated Behavioral Health Services (In Clinic) 4. "From scale of 1-10, how likely are you to follow plan?": 8  Rae LipsJamie C Donat Humble, LCSW  Dep[facility] screen [facility] 2/9 09/23/2017 08/26/2017 07/28/2017 04/27/2015  Decreased Interest 1 3 0 0  Down, Depressed, Hopeless 3 3 0 0  PHQ - 2 Score 4 6 0 0  Altered sleeping 2 3 0 -  Tired, decreased energy 2 2 0 -  Change in appetite 0 2 0 -  Feeling bad or failure about yourself  1 1 0 -  Trouble concentrating 1 0 0 -  Moving slowly or fidgety/restless 0 0 0 -  Suicidal thoughts 1 - 0 -  PHQ-9 Score 11 14 0 -   GAD 7 : Generalized Anxiety Score 09/23/2017 08/26/2017 08/26/2017 07/28/2017  Nervous, Anxious, on Edge 3 1 0 0  Control/stop worrying 3 1 0 0  Worry too much - different things 3 1 0 0  Trouble relaxing 2 1 0 0  Restless 1 1 0 0  Easily annoyed or irritable 1 1 0 0  Afraid - awful might happen 2 1 0 0  Total GAD 7 Score 15 7 0 0

## 2017-09-23 ENCOUNTER — Encounter: Payer: Self-pay | Admitting: Medical

## 2017-09-23 ENCOUNTER — Telehealth: Payer: Self-pay | Admitting: *Deleted

## 2017-09-23 ENCOUNTER — Other Ambulatory Visit: Payer: Self-pay | Admitting: Family Medicine

## 2017-09-23 ENCOUNTER — Ambulatory Visit (HOSPITAL_COMMUNITY)
Admission: RE | Admit: 2017-09-23 | Discharge: 2017-09-23 | Disposition: A | Discharge status: Home / Self Care | Payer: Medicaid Other | Source: Ambulatory Visit | Attending: Family Medicine | Admitting: Family Medicine

## 2017-09-23 ENCOUNTER — Other Ambulatory Visit (HOSPITAL_COMMUNITY): Payer: Self-pay | Admitting: *Deleted

## 2017-09-23 ENCOUNTER — Ambulatory Visit (INDEPENDENT_AMBULATORY_CARE_PROVIDER_SITE_OTHER): Payer: Medicaid Other | Admitting: Clinical

## 2017-09-23 ENCOUNTER — Ambulatory Visit (INDEPENDENT_AMBULATORY_CARE_PROVIDER_SITE_OTHER): Payer: Medicaid Other | Admitting: Medical

## 2017-09-23 ENCOUNTER — Encounter (HOSPITAL_COMMUNITY): Payer: Self-pay

## 2017-09-23 VITALS — BP 113/71 | HR 101 | Wt 233.7 lb

## 2017-09-23 DIAGNOSIS — O0990 Supervision of high risk pregnancy, unspecified, unspecified trimester: Secondary | ICD-10-CM

## 2017-09-23 DIAGNOSIS — O99212 Obesity complicating pregnancy, second trimester: Secondary | ICD-10-CM | POA: Insufficient documentation

## 2017-09-23 DIAGNOSIS — F32A Depression, unspecified: Secondary | ICD-10-CM

## 2017-09-23 DIAGNOSIS — O99340 Other mental disorders complicating pregnancy, unspecified trimester: Secondary | ICD-10-CM

## 2017-09-23 DIAGNOSIS — Z362 Encounter for other antenatal screening follow-up: Secondary | ICD-10-CM | POA: Insufficient documentation

## 2017-09-23 DIAGNOSIS — F4323 Adjustment disorder with mixed anxiety and depressed mood: Secondary | ICD-10-CM | POA: Diagnosis not present

## 2017-09-23 DIAGNOSIS — E669 Obesity, unspecified: Secondary | ICD-10-CM | POA: Insufficient documentation

## 2017-09-23 DIAGNOSIS — O10919 Unspecified pre-existing hypertension complicating pregnancy, unspecified trimester: Secondary | ICD-10-CM

## 2017-09-23 DIAGNOSIS — O10012 Pre-existing essential hypertension complicating pregnancy, second trimester: Secondary | ICD-10-CM | POA: Diagnosis not present

## 2017-09-23 DIAGNOSIS — Z3A25 25 weeks gestation of pregnancy: Secondary | ICD-10-CM

## 2017-09-23 DIAGNOSIS — O10019 Pre-existing essential hypertension complicating pregnancy, unspecified trimester: Secondary | ICD-10-CM | POA: Insufficient documentation

## 2017-09-23 DIAGNOSIS — F329 Major depressive disorder, single episode, unspecified: Secondary | ICD-10-CM

## 2017-09-23 NOTE — Progress Notes (Signed)
   PRENATAL VISIT NOTE  Subjective:  Laura Downs is a 32 y.o. [phone] at 1838w3d being seen today for ongoing prenatal care.  She is currently monitored for the following issues for this high-risk pregnancy and has Supervision of high risk pregnancy, antepartum; Chronic hypertension during pregnancy, antepartum; Depression; Migraines; and Vaginal discharge during pregnancy on their problem list.  Patient reports no complaints.  Contractions: Not present. Vag. Bleeding: None.  Movement: Present. Denies leaking of fluid.   The following portions of the patient's history were reviewed and updated as appropriate: allergies, current medications, past family history, past medical history, past social history, past surgical history and problem list. Problem list updated.  Objective:   Vitals:   09/23/17 0912  BP: 113/71  Pulse: (!) 101  Weight: 233 lb 11.2 oz (106 kg)    Fetal Status: Fetal Heart Rate (bpm): 152 Fundal Height: 26 cm Movement: Present     General:  Alert, oriented and cooperative. Patient is in no acute distress.  Skin: Skin is warm and dry. No rash noted.   Cardiovascular: Normal heart rate noted  Respiratory: Normal respiratory effort, no problems with respiration noted  Abdomen: Soft, gravid, appropriate for gestational age.  Pain/Pressure: Present     Pelvic: Cervical exam deferred        Extremities: Normal range of motion.  Edema: None  Mental Status:  Normal mood and affect. Normal behavior. Normal judgment and thought content.   Assessment and Plan:  Pregnancy: G4P3003 at 438w3d  1. Supervision of high risk pregnancy, antepartum - Doing well aside from issues at work [facility], patient gave me permission to speak with HR rep if they call the office for more information - Per patient, restrictions note given previously and her boss decided that she could no longer work, we did not indicate that  2. Chronic hypertension during pregnancy, antepartum - normotensive  today - encouraged again to started ASA 81 mg   3. Depression during pregnancy, antepartum - Ambulatory referral to Integrated Behavioral Health  Preterm labor symptoms and general obstetric precautions including but not limited to vaginal bleeding, contractions, leaking of fluid and fetal movement were reviewed in detail with the patient. Please refer to After Visit Summary for other counseling recommendations.  Return in about 3 weeks (around 10/14/2017) for LOB, 28 week labs (fasting).   Vonzella NippleJulie Wenzel, PA-C

## 2017-09-23 NOTE — Telephone Encounter (Signed)
Laura Downs with short term disability at patient's work left message, wants a call back for clarification on whether or not patient was allowed to work and/or specific restrictions.

## 2017-09-23 NOTE — Patient Instructions (Signed)
Second Trimester of Pregnancy The second trimester is from week 13 through week 28, month 4 through 6. This is often the time in pregnancy that you feel your best. Often times, morning sickness has lessened or quit. You may have more energy, and you may get hungry more often. Your unborn baby (fetus) is growing rapidly. At the end of the sixth month, he or she is about 9 inches long and weighs about 1 pounds. You will likely feel the baby move (quickening) between 18 and 20 weeks of pregnancy. Follow these instructions at home:  Avoid all smoking, herbs, and alcohol. Avoid drugs not approved by your doctor.  Do not use any tobacco products, including cigarettes, chewing tobacco, and electronic cigarettes. If you need help quitting, ask your doctor. You may get counseling or other support to help you quit.  Only take medicine as told by your doctor. Some medicines are safe and some are not during pregnancy.  Exercise only as told by your doctor. Stop exercising if you start having cramps.  Eat regular, healthy meals.  Wear a good support bra if your breasts are tender.  Do not use hot tubs, steam rooms, or saunas.  Wear your seat belt when driving.  Avoid raw meat, uncooked cheese, and liter boxes and soil used by cats.  Take your prenatal vitamins.  Take 1500-2000 milligrams of calcium daily starting at the 20th week of pregnancy until you deliver your baby.  Try taking medicine that helps you poop (stool softener) as needed, and if your doctor approves. Eat more fiber by eating fresh fruit, vegetables, and whole grains. Drink enough fluids to keep your pee (urine) clear or pale yellow.  Take warm water baths (sitz baths) to soothe pain or discomfort caused by hemorrhoids. Use hemorrhoid cream if your doctor approves.  If you have puffy, bulging veins (varicose veins), wear support hose. Raise (elevate) your feet for 15 minutes, 3-4 times a day. Limit salt in your diet.  Avoid heavy  lifting, wear low heals, and sit up straight.  Rest with your legs raised if you have leg cramps or low back pain.  Visit your dentist if you have not gone during your pregnancy. Use a soft toothbrush to brush your teeth. Be gentle when you floss.  You can have sex (intercourse) unless your doctor tells you not to.  Go to your doctor visits. Get help if:  You feel dizzy.  You have mild cramps or pressure in your lower belly (abdomen).  You have a nagging pain in your belly area.  You continue to feel sick to your stomach (nauseous), throw up (vomit), or have watery poop (diarrhea).  You have bad smelling fluid coming from your vagina.  You have pain with peeing (urination). Get help right away if:  You have a fever.  You are leaking fluid from your vagina.  You have spotting or bleeding from your vagina.  You have severe belly cramping or pain.  You lose or gain weight rapidly.  You have trouble catching your breath and have chest pain.  You notice sudden or extreme puffiness (swelling) of your face, hands, ankles, feet, or legs.  You have not felt the baby move in over an hour.  You have severe headaches that do not go away with medicine.  You have vision changes. This information is not intended to replace advice given to you by your health care provider. Make sure you discuss any questions you have with your health care   provider. Document Released: 09/11/2009 Document Revised: 11/23/2015 Document Reviewed: 08/18/2012 Elsevier Interactive Patient Education  2017 [employer].  

## 2017-09-23 NOTE — Telephone Encounter (Signed)
Returned Ashley's call. Left message on her voice mail RU:[email or web address]:questions about Percell Beltlexis Byer. Please return my call.

## 2017-10-15 ENCOUNTER — Encounter: Payer: Self-pay | Admitting: Medical

## 2017-10-15 ENCOUNTER — Ambulatory Visit (INDEPENDENT_AMBULATORY_CARE_PROVIDER_SITE_OTHER): Payer: Medicaid Other | Admitting: Medical

## 2017-10-15 ENCOUNTER — Other Ambulatory Visit: Payer: Medicaid Other

## 2017-10-15 VITALS — BP 117/73 | HR 97 | Wt 235.3 lb

## 2017-10-15 DIAGNOSIS — O10913 Unspecified pre-existing hypertension complicating pregnancy, third trimester: Secondary | ICD-10-CM

## 2017-10-15 DIAGNOSIS — O0990 Supervision of high risk pregnancy, unspecified, unspecified trimester: Secondary | ICD-10-CM

## 2017-10-15 DIAGNOSIS — O0993 Supervision of high risk pregnancy, unspecified, third trimester: Secondary | ICD-10-CM

## 2017-10-15 DIAGNOSIS — O10919 Unspecified pre-existing hypertension complicating pregnancy, unspecified trimester: Secondary | ICD-10-CM

## 2017-10-15 LAB — POCT URINALYSIS DIP (DEVICE)
BILIRUBIN URINE: NEGATIVE
Glucose, UA: NEGATIVE mg/dL
KETONES UR: NEGATIVE mg/dL
LEUKOCYTES UA: NEGATIVE
NITRITE: NEGATIVE
Protein, ur: 30 mg/dL — AB
Specific Gravity, Urine: 1.025 (ref 1.005–1.030)
Urobilinogen, UA: 1 mg/dL (ref 0.0–1.0)
pH: 7 (ref 5.0–8.0)

## 2017-10-15 MED ORDER — TETANUS-DIPHTH-ACELL PERTUSSIS 5-2.5-18.5 LF-MCG/0.5 IM SUSP: 0.5000 mL | Freq: Once | INTRAMUSCULAR | Status: DC

## 2017-10-15 NOTE — Progress Notes (Signed)
   PRENATAL VISIT NOTE  Subjective:  Laura Downs is a 32 y.o. [phone] at 554w4d being seen today for ongoing prenatal care.  She is currently monitored for the following issues for this high-risk pregnancy and has Supervision of high risk pregnancy, antepartum; Chronic hypertension during pregnancy, antepartum; Depression; Migraines; Vaginal discharge during pregnancy; [redacted] weeks gestation of pregnancy; Chronic benign essential hypertension, antepartum; Encounter for other antenatal screening follow-up; and Obesity complicating pregnancy in second trimester on their problem list.  Patient reports no complaints.  Contractions: Not present. Vag. Bleeding: None.  Movement: Present. Denies leaking of fluid.   The following portions of the patient's history were reviewed and updated as appropriate: allergies, current medications, past family history, past medical history, past social history, past surgical history and problem list. Problem list updated.  Objective:   Vitals:   10/15/17 0830  BP: 117/73  Pulse: 97  Weight: 235 lb 4.8 oz (106.7 kg)    Fetal Status: Fetal Heart Rate (bpm): 154 Fundal Height: 28 cm Movement: Present     General:  Alert, oriented and cooperative. Patient is in no acute distress.  Skin: Skin is warm and dry. No rash noted.   Cardiovascular: Normal heart rate noted  Respiratory: Normal respiratory effort, no problems with respiration noted  Abdomen: Soft, gravid, appropriate for gestational age.  Pain/Pressure: Present     Pelvic: Cervical exam deferred        Extremities: Normal range of motion.  Edema: Trace  Mental Status: Normal mood and affect. Normal behavior. Normal judgment and thought content.   Assessment and Plan:  Pregnancy: G4P3003 at 684w4d  1. Chronic hypertension during pregnancy, antepartum - Normotensive today without concerning symptoms   2. Supervision of high risk pregnancy, antepartum - 2 hour GTT today - CBC, HIV and RPR today    Preterm labor symptoms and general obstetric precautions including but not limited to vaginal bleeding, contractions, leaking of fluid and fetal movement were reviewed in detail with the patient. Please refer to After Visit Summary for other counseling recommendations.  [facility] about 2 weeks (around 10/29/2017[facility]  Future Appointments  Date Time Provider Department Center  10/15/2017  9:15 AM Kathlene CoteWenzel, Anuj Summons N, PA-C [facility]  10/21/2017  9:30 AM [facility]-M[facility] US 5 [facility]-M[facility]US MFC-US  11/18/2017  9:30 AM [facility]-MFC US 5 WH-MFCUS MFC-US    Vonzella NippleJulie Shakemia Madera, PA-C

## 2017-10-15 NOTE — Progress Notes (Signed)
Declines tdap vaccine today, may get it next visit.

## 2017-10-15 NOTE — Patient Instructions (Signed)

## 2017-10-16 LAB — CBC
HEMATOCRIT: 36.1 % (ref 34.0–46.6)
Hemoglobin: 11.9 g/dL (ref 11.1–15.9)
MCH: 30 pg (ref 26.6–33.0)
MCHC: 33 g/dL (ref 31.5–35.7)
MCV: 91 fL (ref 79–97)
Platelets: 254 10*3/uL (ref 150–379)
RBC: 3.97 x10E6/uL (ref 3.77–5.28)
RDW: 14.4 % (ref 12.3–15.4)
WBC: 8.5 10*3/uL (ref 3.4–10.8)

## 2017-10-16 LAB — RPR: RPR Ser Ql: NONREACTIVE

## 2017-10-16 LAB — HIV ANTIBODY (ROUTINE TESTING W REFLEX): HIV Screen 4th Generation wRfx: NONREACTIVE

## 2017-10-16 LAB — GLUCOSE TOLERANCE, 2 HOURS W/ 1HR
GLUCOSE, FASTING: 80 mg/dL (ref 65–91)
Glucose, 1 hour: 121 mg/dL (ref 65–179)
Glucose, 2 hour: 91 mg/dL (ref 65–152)

## 2017-10-21 ENCOUNTER — Encounter (HOSPITAL_COMMUNITY): Payer: Self-pay

## 2017-10-21 ENCOUNTER — Ambulatory Visit (HOSPITAL_COMMUNITY)
Admission: RE | Admit: 2017-10-21 | Discharge: 2017-10-21 | Disposition: A | Discharge status: Home / Self Care | Payer: Medicaid Other | Source: Ambulatory Visit | Attending: Medical | Admitting: Medical

## 2017-10-21 DIAGNOSIS — O10913 Unspecified pre-existing hypertension complicating pregnancy, third trimester: Secondary | ICD-10-CM | POA: Diagnosis not present

## 2017-10-21 DIAGNOSIS — O10019 Pre-existing essential hypertension complicating pregnancy, unspecified trimester: Secondary | ICD-10-CM | POA: Diagnosis present

## 2017-10-21 DIAGNOSIS — O10919 Unspecified pre-existing hypertension complicating pregnancy, unspecified trimester: Secondary | ICD-10-CM

## 2017-10-21 DIAGNOSIS — Z3A29 29 weeks gestation of pregnancy: Secondary | ICD-10-CM | POA: Diagnosis not present

## 2017-10-30 ENCOUNTER — Ambulatory Visit (INDEPENDENT_AMBULATORY_CARE_PROVIDER_SITE_OTHER): Payer: Medicaid Other | Admitting: Student

## 2017-10-30 VITALS — BP 119/75 | HR 96 | Wt 236.9 lb

## 2017-10-30 DIAGNOSIS — O10913 Unspecified pre-existing hypertension complicating pregnancy, third trimester: Secondary | ICD-10-CM

## 2017-10-30 DIAGNOSIS — O0990 Supervision of high risk pregnancy, unspecified, unspecified trimester: Secondary | ICD-10-CM

## 2017-10-30 DIAGNOSIS — O10919 Unspecified pre-existing hypertension complicating pregnancy, unspecified trimester: Secondary | ICD-10-CM

## 2017-10-30 DIAGNOSIS — O0993 Supervision of high risk pregnancy, unspecified, third trimester: Secondary | ICD-10-CM

## 2017-10-30 NOTE — Patient Instructions (Signed)

## 2017-10-30 NOTE — Progress Notes (Signed)
   PRENATAL VISIT NOTE  Subjective:  Laura Downs is a 32 y.o. [phone] at [redacted]w[redacted]d being seen today for ongoing prenatal care.  She is currently monitored for the following issues for this high-risk pregnancy and has Supervision of high risk pregnancy, antepartum; Chronic hypertension during pregnancy, antepartum; Depression; Migraines; Vaginal discharge during pregnancy; [redacted] weeks gestation of pregnancy; Chronic benign essential hypertension, antepartum; Encounter for other antenatal screening follow-up; and Obesity complicating pregnancy in second trimester on their problem list.  Patient reports no complaints.  Contractions: Not present. Vag. Bleeding: None.  Movement: Present. Denies leaking of fluid.   The following portions of the patient's history were reviewed and updated as appropriate: allergies, current medications, past family history, past medical history, past social history, past surgical history and problem list. Problem list updated.  Objective:   Vitals:   10/30/17 1202  BP: 119/75  Pulse: 96  Weight: 236 lb 14.4 oz (107.5 kg)    Fetal Status: Fetal Heart Rate (bpm): 135 Fundal Height: 30 cm Movement: Present     General:  Alert, oriented and cooperative. Patient is in no acute distress.  Skin: Skin is warm and dry. No rash noted.   Cardiovascular: Normal heart rate noted  Respiratory: Normal respiratory effort, no problems with respiration noted  Abdomen: Soft, gravid, appropriate for gestational age.  Pain/Pressure: Present     Pelvic: Cervical exam deferred        Extremities: Normal range of motion.  Edema: Mild pitting, slight indentation  Mental Status: Normal mood and affect. Normal behavior. Normal judgment and thought content.   Assessment and Plan:  Pregnancy: G4P3003 at [redacted]w[redacted]d  1. Supervision of high risk pregnancy, antepartum -doing well -interested in BTL -- 30 day papers signed today   2. Chronic hypertension during pregnancy, antepartum -BP good  today -Continues to take baby ASA -start antenatal testing at next visit  Preterm labor symptoms and general obstetric precautions including but not limited to vaginal bleeding, contractions, leaking of fluid and fetal movement were reviewed in detail with the patient. Please refer to After Visit Summary for other counseling recommendations.  Return in about 2 weeks (around 11/13/2017) for High Risk OB.  Future Appointments  Date Time Provider Department Center  11/14/2017  8:55 AM [place] Bing, MD [facility] WOC  11/18/2017  9:30 AM WH-MFC [place] 5 WH-MFCUS MFC-US  11/28/2017  8:55 AM Levie Heritage, DO [facility]  12/11/2017 11:15 AM Adam Phenix, MD [facility]  12/18/2017  1:15 PM [place] Bing, MD [facility] WOC  12/26/2017  8:15 AM Hermina Staggers, MD [facility]    Judeth Horn, NP

## 2017-10-31 ENCOUNTER — Encounter: Payer: Self-pay | Admitting: *Deleted

## 2017-10-31 ENCOUNTER — Encounter (INDEPENDENT_AMBULATORY_CARE_PROVIDER_SITE_OTHER): Payer: Self-pay

## 2017-11-14 ENCOUNTER — Ambulatory Visit (INDEPENDENT_AMBULATORY_CARE_PROVIDER_SITE_OTHER): Payer: Medicaid Other | Admitting: *Deleted

## 2017-11-14 ENCOUNTER — Encounter: Payer: Self-pay | Admitting: Obstetrics and Gynecology

## 2017-11-14 ENCOUNTER — Ambulatory Visit (INDEPENDENT_AMBULATORY_CARE_PROVIDER_SITE_OTHER): Payer: Medicaid Other | Admitting: Obstetrics and Gynecology

## 2017-11-14 ENCOUNTER — Ambulatory Visit: Payer: Self-pay

## 2017-11-14 VITALS — BP 120/77 | HR 122 | Wt 240.1 lb

## 2017-11-14 DIAGNOSIS — O0990 Supervision of high risk pregnancy, unspecified, unspecified trimester: Secondary | ICD-10-CM

## 2017-11-14 DIAGNOSIS — O10913 Unspecified pre-existing hypertension complicating pregnancy, third trimester: Secondary | ICD-10-CM

## 2017-11-14 DIAGNOSIS — O10919 Unspecified pre-existing hypertension complicating pregnancy, unspecified trimester: Secondary | ICD-10-CM

## 2017-11-14 DIAGNOSIS — O99213 Obesity complicating pregnancy, third trimester: Secondary | ICD-10-CM

## 2017-11-14 DIAGNOSIS — O99212 Obesity complicating pregnancy, second trimester: Secondary | ICD-10-CM

## 2017-11-14 DIAGNOSIS — F329 Major depressive disorder, single episode, unspecified: Secondary | ICD-10-CM

## 2017-11-14 DIAGNOSIS — E669 Obesity, unspecified: Secondary | ICD-10-CM

## 2017-11-14 DIAGNOSIS — O0993 Supervision of high risk pregnancy, unspecified, third trimester: Secondary | ICD-10-CM

## 2017-11-14 DIAGNOSIS — O99343 Other mental disorders complicating pregnancy, third trimester: Secondary | ICD-10-CM

## 2017-11-14 NOTE — Progress Notes (Signed)
Pt reports nausea today.

## 2017-11-15 NOTE — Progress Notes (Addendum)
Prenatal Visit Note Date: 11/14/2017 Clinic: [facility]  Subjective:  Laura Downs is a 32 y.o. [phone] at 32w06 being seen today for ongoing prenatal care.  She is currently monitored for the following issues for this low-risk pregnancy and has Supervision of high risk pregnancy, antepartum; Chronic hypertension during pregnancy, antepartum; Depression; Migraines; Vaginal discharge during pregnancy; Chronic benign essential hypertension, antepartum; Encounter for other antenatal screening follow-up; and Obesity complicating pregnancy in second trimester on their problem list.  Patient reports that she is doing well.   Contractions: Irregular. Vag. Bleeding: None.  Movement: Present. Denies leaking of fluid.   The following portions of the patient's history were reviewed and updated as appropriate: allergies, current medications, past family history, past medical history, past social history, past surgical history and problem list. Problem list updated.  Objective:   Vitals:   11/14/17 1006  BP: 120/77  Pulse: (!) 122  Weight: 240 lb 1.6 oz (108.9 kg)    Fetal Status: Fetal Heart Rate (bpm): NST   Movement: Present     General:  Alert, oriented and cooperative. Patient is in no acute distress.  Skin: Skin is warm and dry. No rash noted.   Cardiovascular: Normal heart rate noted. HR 90s  Respiratory: Normal respiratory effort, no problems with respiration noted  Abdomen: Soft, gravid, appropriate for gestational age. Pain/Pressure: Present     Pelvic:  Cervical exam deferred        Extremities: Normal range of motion.  Edema: Trace  Mental Status: Normal mood and affect. Normal behavior. Normal judgment and thought content.   Urinalysis:      Assessment and Plan:  Pregnancy: G4P3003 at [redacted]w[redacted]d  1. Chronic hypertension during pregnancy, antepartum Routine care. Doing well on just low dose asa. 4/23 growth/afi/ac normal. Rpt already scheduled. bpp today 6/8  with -2 for breathing. Continue with weekly testing - [place] MFM FETAL BPP WO NON STRESS; Future  2. Supervision of high risk pregnancy, antepartum Routine care. BTL papers already signed. 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  - [place] MFM FETAL BPP WO NON STRESS; Future  3. Obesity complicating pregnancy in second trimester  4. Depression during pregnancy in third trimester Doing well on zoloft  Preterm labor symptoms and general obstetric precautions including but not limited to vaginal bleeding, contractions, leaking of fluid and fetal movement were reviewed in detail with the patient. Please refer to After Visit Summary for other counseling recommendations.  Return in about 4 days (around 11/18/2017) for NST only - has [place] @ 1030.   Nenzel Bing, MD

## 2017-11-18 ENCOUNTER — Ambulatory Visit (INDEPENDENT_AMBULATORY_CARE_PROVIDER_SITE_OTHER): Payer: Medicaid Other | Admitting: *Deleted

## 2017-11-18 ENCOUNTER — Other Ambulatory Visit (HOSPITAL_COMMUNITY): Payer: Self-pay | Admitting: *Deleted

## 2017-11-18 ENCOUNTER — Other Ambulatory Visit (HOSPITAL_COMMUNITY): Payer: Self-pay | Admitting: Obstetrics and Gynecology

## 2017-11-18 ENCOUNTER — Other Ambulatory Visit: Payer: Self-pay | Admitting: Obstetrics and Gynecology

## 2017-11-18 ENCOUNTER — Encounter (HOSPITAL_COMMUNITY): Payer: Self-pay

## 2017-11-18 ENCOUNTER — Ambulatory Visit (HOSPITAL_COMMUNITY)
Admission: RE | Admit: 2017-11-18 | Discharge: 2017-11-18 | Disposition: A | Discharge status: Home / Self Care | Payer: Medicaid Other | Source: Ambulatory Visit | Attending: Obstetrics and Gynecology | Admitting: Obstetrics and Gynecology

## 2017-11-18 VITALS — BP 119/72 | HR 95

## 2017-11-18 DIAGNOSIS — O0990 Supervision of high risk pregnancy, unspecified, unspecified trimester: Secondary | ICD-10-CM

## 2017-11-18 DIAGNOSIS — O10913 Unspecified pre-existing hypertension complicating pregnancy, third trimester: Secondary | ICD-10-CM | POA: Diagnosis present

## 2017-11-18 DIAGNOSIS — O10919 Unspecified pre-existing hypertension complicating pregnancy, unspecified trimester: Secondary | ICD-10-CM

## 2017-11-18 DIAGNOSIS — O0993 Supervision of high risk pregnancy, unspecified, third trimester: Secondary | ICD-10-CM | POA: Diagnosis not present

## 2017-11-18 DIAGNOSIS — Z3A33 33 weeks gestation of pregnancy: Secondary | ICD-10-CM

## 2017-11-18 DIAGNOSIS — O99213 Obesity complicating pregnancy, third trimester: Secondary | ICD-10-CM | POA: Diagnosis not present

## 2017-11-18 DIAGNOSIS — Z362 Encounter for other antenatal screening follow-up: Secondary | ICD-10-CM

## 2017-11-18 NOTE — Progress Notes (Signed)
US for growth and BPP today 

## 2017-11-18 NOTE — Progress Notes (Signed)
NST performed today was reviewed and was found to be reactive.  Continue recommended antenatal testing and prenatal care.  

## 2017-11-19 ENCOUNTER — Encounter (HOSPITAL_COMMUNITY): Payer: Self-pay | Admitting: *Deleted

## 2017-11-19 ENCOUNTER — Inpatient Hospital Stay (HOSPITAL_COMMUNITY)
Admission: AD | Admit: 2017-11-19 | Discharge: 2017-11-19 | Disposition: A | Discharge status: Home / Self Care | Payer: Medicaid Other | Source: Ambulatory Visit | Attending: Obstetrics and Gynecology | Admitting: Obstetrics and Gynecology

## 2017-11-19 ENCOUNTER — Other Ambulatory Visit: Payer: Self-pay

## 2017-11-19 DIAGNOSIS — Z3A33 33 weeks gestation of pregnancy: Secondary | ICD-10-CM | POA: Diagnosis not present

## 2017-11-19 DIAGNOSIS — Z7982 Long term (current) use of aspirin: Secondary | ICD-10-CM | POA: Insufficient documentation

## 2017-11-19 DIAGNOSIS — M545 Low back pain: Secondary | ICD-10-CM | POA: Diagnosis present

## 2017-11-19 DIAGNOSIS — G43909 Migraine, unspecified, not intractable, without status migrainosus: Secondary | ICD-10-CM | POA: Diagnosis not present

## 2017-11-19 DIAGNOSIS — O10013 Pre-existing essential hypertension complicating pregnancy, third trimester: Secondary | ICD-10-CM | POA: Diagnosis not present

## 2017-11-19 DIAGNOSIS — O26893 Other specified pregnancy related conditions, third trimester: Secondary | ICD-10-CM | POA: Insufficient documentation

## 2017-11-19 DIAGNOSIS — O4703 False labor before 37 completed weeks of gestation, third trimester: Secondary | ICD-10-CM | POA: Diagnosis not present

## 2017-11-19 DIAGNOSIS — O479 False labor, unspecified: Secondary | ICD-10-CM

## 2017-11-19 DIAGNOSIS — O0990 Supervision of high risk pregnancy, unspecified, unspecified trimester: Secondary | ICD-10-CM

## 2017-11-19 DIAGNOSIS — O10919 Unspecified pre-existing hypertension complicating pregnancy, unspecified trimester: Secondary | ICD-10-CM

## 2017-11-19 LAB — URINALYSIS, ROUTINE W REFLEX MICROSCOPIC
Bilirubin Urine: NEGATIVE
GLUCOSE, UA: NEGATIVE mg/dL
Ketones, ur: NEGATIVE mg/dL
Leukocytes, UA: NEGATIVE
NITRITE: NEGATIVE
PROTEIN: 30 mg/dL — AB
SPECIFIC GRAVITY, URINE: 1.019 (ref 1.005–1.030)
Squamous Epithelial / LPF: >50 — ABNORMAL HIGH (ref 0–5)
pH: 7 (ref 5.0–8.0)

## 2017-11-19 MED ORDER — BUTALBITAL-APAP-CAFFEINE 50-325-40 MG PO TABS: 1.0000 | ORAL_TABLET | Freq: Four times a day (QID) | ORAL | 0 refills | Status: AC | PRN

## 2017-11-19 MED ORDER — BUTALBITAL-APAP-CAFFEINE 50-325-40 MG PO TABS
1.0000 | ORAL_TABLET | Freq: Once | ORAL | Status: AC
  Administered 2017-11-19: 1 via ORAL
  Filled 2017-11-19: qty 1

## 2017-11-19 NOTE — Discharge Instructions (Signed)
Braxton Hicks Contractions °Contractions of the uterus can occur throughout pregnancy, but they are not always a sign that you are in labor. You may have practice contractions called Braxton Hicks contractions. These false labor contractions are sometimes confused with true labor. °What are Braxton Hicks contractions? °Braxton Hicks contractions are tightening movements that occur in the muscles of the uterus before labor. Unlike true labor contractions, these contractions do not result in opening (dilation) and thinning of the cervix. Toward the end of pregnancy (32-34 weeks), Braxton Hicks contractions can happen more often and may become stronger. These contractions are sometimes difficult to tell apart from true labor because they can be very uncomfortable. You should not feel embarrassed if you go to the hospital with false labor. °Sometimes, the only way to tell if you are in true labor is for your health care provider to look for changes in the cervix. The health care provider will do a physical exam and may monitor your contractions. If you are not in true labor, the exam should show that your cervix is not dilating and your water has not broken. °If there are other health problems associated with your pregnancy, it is completely safe for you to be sent home with false labor. You may continue to have Braxton Hicks contractions until you go into true labor. °How to tell the difference between true labor and false labor °True labor °· Contractions last 30-70 seconds. °· Contractions become very regular. °· Discomfort is usually felt in the top of the uterus, and it spreads to the lower abdomen and low back. °· Contractions do not go away with walking. °· Contractions usually become more intense and increase in frequency. °· The cervix dilates and gets thinner. °False labor °· Contractions are usually shorter and not as strong as true labor contractions. °· Contractions are usually irregular. °· Contractions  are often felt in the front of the lower abdomen and in the groin. °· Contractions may go away when you walk around or change positions while lying down. °· Contractions get weaker and are shorter-lasting as time goes on. °· The cervix usually does not dilate or become thin. °Follow these instructions at home: °· Take over-the-counter and prescription medicines only as told by your health care provider. °· Keep up with your usual exercises and follow other instructions from your health care provider. °· Eat and drink lightly if you think you are going into labor. °· If Braxton Hicks contractions are making you uncomfortable: °? Change your position from lying down or resting to walking, or change from walking to resting. °? Sit and rest in a tub of warm water. °? Drink enough fluid to keep your urine pale yellow. Dehydration may cause these contractions. °? Do slow and deep breathing several times an hour. °· Keep all follow-up prenatal visits as told by your health care provider. This is important. °Contact a health care provider if: °· You have a fever. °· You have continuous pain in your abdomen. °Get help right away if: °· Your contractions become stronger, more regular, and closer together. °· You have fluid leaking or gushing from your vagina. °· You pass blood-tinged mucus (bloody show). °· You have bleeding from your vagina. °· You have low back pain that you never had before. °· You feel your baby’s head pushing down and causing pelvic pressure. °· Your baby is not moving inside you as much as it used to. °Summary °· Contractions that occur before labor are called Braxton   Hicks contractions, false labor, or practice contractions. °· Braxton Hicks contractions are usually shorter, weaker, farther apart, and less regular than true labor contractions. True labor contractions usually become progressively stronger and regular and they become more frequent. °· Manage discomfort from Braxton Hicks contractions by  changing position, resting in a warm bath, drinking plenty of water, or practicing deep breathing. °This information is not intended to replace advice given to you by your health care provider. Make sure you discuss any questions you have with your health care provider. °Document Released: 10/31/2016 Document Revised: 10/31/2016 Document Reviewed: 10/31/2016 °Elsevier Interactive Patient Education © 2018 [employer] ° °

## 2017-11-19 NOTE — MAU Provider Note (Signed)
Chief Complaint:  Back Pain; Headache; and Contractions   None     HPI: Laura Downs is a 32 y.o. [phone] at [redacted]w[redacted]d who presents to MAU reporting contractions with lower back pain. Contractions started yesterday.  She has cramping and back pain that starts in the back and goes around both sides to the front of her stomach.  She feels her stomach tightening.  States that the most she has had was two episodes of cramping in an hour. Cramping is irregular.  Patient has no history of preterm deliveries.  Patient concerned that she could be going into labor.  Patient also endorses headache for the last 3 days.  She has a history of migraines and chronic headaches.  She usually takes Tylenol for headaches and it resolves.  She took Tylenol yesterday but it did not help.  Currently has a headache that she is rating 7/10.  Pregnancy complicated by chronic hypertension.  Patient only taking aspirin.  States her blood pressures have been normal throughout pregnancy.  Not on any blood pressure medications.  Denies leakage of fluid, vaginal discharge, or vaginal bleeding. Good fetal movement.   Pregnancy Course:  Patient Active Problem List   Diagnosis Date Noted  . Chronic benign essential hypertension, antepartum   . Encounter for other antenatal screening follow-up   . Obesity complicating pregnancy in second trimester   . Vaginal discharge during pregnancy 08/26/2017  . Supervision of high risk pregnancy, antepartum 07/28/2017  . Chronic hypertension during pregnancy, antepartum 07/28/2017  . Migraines 07/28/2017  . Depression    Past Medical History: Past Medical History:  Diagnosis Date  . Depression    current pregnancy  . Hypertension   . Vaginal Pap smear, abnormal     Past obstetric history: OB History  Gravida Para Term Preterm AB Living  4 3 3  0 0 3  SAB TAB Ectopic Multiple Live Births  0 0 0 0 3    # Outcome Date GA Lbr Len/2nd Weight Sex Delivery Anes PTL Lv  4 Current            3 Term 11/06/15 [redacted]w[redacted]d 01:05 / 00:03 3.274 kg (7 lb 3.5 oz) F Vag-Spont None  LIV     Birth Comments: Gestational Hypertension at 38 weeks  2 Term 03/23/08 [redacted]w[redacted]d  2.863 kg (6 lb 5 oz) F Vag-Spont EPI  LIV  1 Term 12/19/04   3.175 kg (7 lb) M Vag-Spont EPI  LIV    Past Surgical History: Past Surgical History:  Procedure Laterality Date  . COLPOSCOPY    . LEEP  2008   paps have been normal since.     Family History: History reviewed. No pertinent family history.  Social History: Social History   Tobacco Use  . Smoking status: Never Smoker  . Smokeless tobacco: Never Used  Substance Use Topics  . Alcohol use: No  . Drug use: No    Allergies: No Known Allergies  Meds:  Facility-Administered Medications Prior to Admission  Medication Dose Route Frequency Provider Last Rate Last Dose  . Tdap (BOOSTRIX) injection 0.5 mL  0.5 mL Intramuscular Once Marny Lowenstein, PA-C       Medications Prior to Admission  Medication Sig Dispense Refill Last Dose  . acetaminophen (TYLENOL) 500 MG tablet Take 500 mg by mouth every 6 (six) hours as needed for mild pain or headache.   11/18/2017 at Unknown time  . aspirin EC 81 MG tablet Take 81 mg by mouth daily.  11/18/2017 at Unknown time  . cyclobenzaprine (FLEXERIL) 10 MG tablet Take 0.5-1 tablets (5-10 mg total) by mouth 3 (three) times daily as needed for muscle spasms. (Patient not taking: Reported on 11/14/2017) 30 tablet 0 Not Taking  . Prenatal Vit-Fe Phos-FA-Omega (VITAFOL GUMMIES) 3.33-0.333-34.8 MG CHEW Chew 1 tablet by mouth 2 (two) times daily.   1 11/17/2017  . sertraline (ZOLOFT) 25 MG tablet Take 1 tablet (25 mg total) by mouth daily. (Patient not taking: Reported on 10/15/2017) 30 tablet 2 Not Taking    I have reviewed patient's Past Medical Hx, Surgical Hx, Family Hx, Social Hx, medications and allergies.   ROS:  All systems reviewed and are negative for acute change except as noted in the HPI.   Physical Exam   Patient  Vitals for the past 24 hrs:  BP Temp Temp src Pulse Resp SpO2  11/19/17 1008 103/85 98.1 F (36.7 C) Oral (!) 102 18 97 %   Constitutional: Well-developed, well-nourished female in no acute distress.  Cardiovascular: normal rate and rhythm, pulses intact Respiratory: normal rate and effort.  GI: Abd soft, non-tender, gravid appropriate for gestational age.  MS: Extremities nontender, no edema, normal ROM Neurologic: Alert and oriented x 4. No cranial nerve deficits appreciated.  Pelvic: NEFG, physiologic discharge, no blood. No CMT Psych: normal mood and affect Dilation: Closed Effacement (%): Thick Station: Ballotable Exam by:: Dr. Doroteo Glassman   Labs: Results for orders placed or performed during the hospital encounter of 11/19/17 (from the past 24 hour(s))  Urinalysis, Routine w reflex microscopic     Status: Abnormal   Collection Time: 11/19/17  9:49 AM  Result Value Ref Range   Color, Urine YELLOW YELLOW   APPearance CLOUDY (A) CLEAR   Specific Gravity, Urine 1.019 1.005 - 1.030   pH 7.0 5.0 - 8.0   Glucose, UA NEGATIVE NEGATIVE mg/dL   Hgb urine dipstick SMALL (A) NEGATIVE   Bilirubin Urine NEGATIVE NEGATIVE   Ketones, ur NEGATIVE NEGATIVE mg/dL   Protein, ur 30 (A) NEGATIVE mg/dL   Nitrite NEGATIVE NEGATIVE   Leukocytes, UA NEGATIVE NEGATIVE   RBC / HPF 0-5 0 - 5 RBC/hpf   WBC, UA 0-5 0 - 5 WBC/hpf   Bacteria, UA FEW (A) NONE SEEN   Squamous Epithelial / LPF >50 (H) 0 - 5   Mucus PRESENT     Imaging:  [place] Mfm Fetal Bpp Wo Non Stress  Result Date: 11/18/2017 ----------------------------------------------------------------------  OBSTETRICS REPORT                      (Signed Final 11/18/2017 11:12 am) ---------------------------------------------------------------------- Patient Info  ID #:       [id number]                          D.O.B.:  11-29-1985 (32 yrs)  Name:       Laura Downs                 Visit Date: 11/18/2017 10:01 am  ---------------------------------------------------------------------- Performed By  Performed By:     Tomma Lightning             Ref. Address:     [address]                    [place]  [place], [place]                                                             [id number]  Attending:        Darlyn Read MD         Location:         [facility]  Referred By:      [facility]-                    Faculty Physician ---------------------------------------------------------------------- Orders   #  Description                                 Code   1  [place] MFM OB FOLLOW UP                         [phone]   2  [place] MFM FETAL BPP WO NON STRESS              41324.40  ----------------------------------------------------------------------   #  Ordered By               Order #        Accession #    Episode #   1  JEFFREY Marjo Bicker           [id number]      [id number]     [id number]   2  CHARLIE PICKENS          [id number]      [id number]     [id number]  ---------------------------------------------------------------------- Indications   [redacted] weeks gestation of pregnancy                Z3A.33   Hypertension - Chronic/Pre-existing            O10.019   Obesity complicating pregnancy, third          O99.213   trimester   Encounter for other antenatal screening        Z36.2   follow-up  ---------------------------------------------------------------------- OB History  Blood Type:            Height:  5'5"   Weight (lb):  224       BMI:  37.27  Gravidity:    4         Term:   3        Prem:   0        SAB:   0  TOP:          0       Ectopic:  0        Living: 3 ---------------------------------------------------------------------- Fetal Evaluation  Num Of Fetuses:     1  Fetal Heart  164  Rate(bpm):  Cardiac Activity:   Observed  Presentation:       Cephalic  Placenta:            Posterior, above cervical os  P. Cord Insertion:  Previously Visualized  Amniotic Fluid  AFI FV:      Subjectively within normal limits  AFI Sum(cm)     %Tile       Largest Pocket(cm)  12.21           35          4.5  RUQ(cm)       RLQ(cm)       LUQ(cm)        LLQ(cm)  4.5           1.64          2.87           3.2 ---------------------------------------------------------------------- Biophysical Evaluation  Amniotic F.V:   Within normal limits       F. Tone:        Observed  F. Movement:    Observed                   Score:          8/8  F. Breathing:   Observed ---------------------------------------------------------------------- Biometry  BPD:        81  mm     G. Age:  32w 4d         21  %    CI:        73.62   %    70 - 86                                                          FL/HC:      21.0   %    19.9 - 21.5  HC:      299.9  mm     G. Age:  33w 2d         13  %    HC/AC:      1.05        0.96 - 1.11  AC:      286.7  mm     G. Age:  32w 5d         31  %    FL/BPD:     77.9   %    71 - 87  FL:       63.1  mm     G. Age:  32w 5d         21  %    FL/AC:      22.0   %    20 - 24  HUM:      55.8  mm     G. Age:  32w 3d         38  %  Est. FW:    2034  gm      4 lb 8 oz     44  % ---------------------------------------------------------------------- Gestational Age  LMP:           34w 4d        Date:  03/21/17                 EDD:  12/26/17  U/S Today:     32w 6d                                        EDD:   01/07/18  Best:          33w 3d     Det. By:  U/S C R L  (06/26/17)    EDD:   01/03/18 ---------------------------------------------------------------------- Anatomy  Cranium:               Appears normal         Aortic Arch:            Previously seen  Cavum:                 Appears normal         Ductal Arch:            Previously seen  Ventricles:            Appears normal         Diaphragm:              Appears normal  Choroid Plexus:        Previously seen        Stomach:                Appears  normal, left                                                                        sided  Cerebellum:            Previously seen        Abdomen:                Appears normal  Posterior Fossa:       Previously seen        Abdominal Wall:         Previously seen  Nuchal Fold:           Previously seen        Cord Vessels:           Previously seen  Face:                  Orbits and profile     Kidneys:                Appear normal                         previously seen  Lips:                  Previously seen        Bladder:                Appears normal  Thoracic:              Appears normal         Spine:                  Previously seen  Heart:  Appears normal         Upper Extremities:      Previously seen                         (4CH, axis, and situs  RVOT:                  Previously seen        Lower Extremities:      Previously seen  LVOT:                  Previously seen  Other:  Fetus appears to be a female prev seen. Heels prev visualized. Nasal          bone prev visualized. Technically difficult due to maternal habitus and          fetal position. ---------------------------------------------------------------------- Cervix Uterus Adnexa  Cervix  Not visualized (advanced GA >29wks) ---------------------------------------------------------------------- Impression  Single living intrauterine pregnancy at 33w 3d.  Cephalic presentation.  Placenta Posterior, above cervical os.  Normal amniotic fluid volume.  Appropriate interval fetal growth.  Normal interval fetal anatomy.  BPP 8/8. ---------------------------------------------------------------------- Recommendations  Continue antenatal testing and serial ultrasound for fetal  growth. ----------------------------------------------------------------------                   Darlyn Read, MD Electronically Signed Final Report   11/18/2017 11:12 am ----------------------------------------------------------------------  [place] Mfm Ob Follow  Up  Result Date: 11/18/2017 ----------------------------------------------------------------------  OBSTETRICS REPORT                      (Signed Final 11/18/2017 11:12 am) ---------------------------------------------------------------------- Patient Info  ID #:       [id number]                          D.O.B.:  [birth year] (32 yrs)  Name:       Laura Downs                 Visit Date: 11/18/2017 10:01 am ---------------------------------------------------------------------- Performed By  Performed By:     Tomma Lightning             Ref. Address:     [address]                    [place]                                                             [place], [place]                                                             [address]  Attending:  Darlyn Read MD         Location:         [facility]  Referred By:      [facility]-                    Faculty Physician ---------------------------------------------------------------------- Orders   #  Description                                 Code   1  [place] MFM OB FOLLOW UP                         45409.81   2  [place] MFM FETAL BPP WO NON STRESS              19147.82  ----------------------------------------------------------------------   #  Ordered By               Order #        Accession #    Episode #   1  JEFFREY Marjo Bicker           [id number]      [id number]     [id number]   2  CHARLIE PICKENS          [id number]      [id number]     [id number]  ---------------------------------------------------------------------- Indications   [redacted] weeks gestation of pregnancy                Z3A.33   Hypertension - Chronic/Pre-existing            O10.019   Obesity complicating pregnancy, third          O99.213   trimester   Encounter for other antenatal screening        Z36.2   follow-up  ---------------------------------------------------------------------- OB History  Blood Type:             Height:  5'5"   Weight (lb):  224       BMI:  37.27  Gravidity:    4         Term:   3        Prem:   0        SAB:   0  TOP:          0       Ectopic:  0        Living: 3 ---------------------------------------------------------------------- Fetal Evaluation  Num Of Fetuses:     1  Fetal Heart         164  Rate(bpm):  Cardiac Activity:   Observed  Presentation:       Cephalic  Placenta:           Posterior, above cervical os  P. Cord Insertion:  Previously Visualized  Amniotic Fluid  AFI FV:      Subjectively within normal limits  AFI Sum(cm)     %Tile       Largest Pocket(cm)  12.21           35          4.5  RUQ(cm)       RLQ(cm)       LUQ(cm)        LLQ(cm)  4.5  1.64          2.87           3.2 ---------------------------------------------------------------------- Biophysical Evaluation  Amniotic F.V:   Within normal limits       F. Tone:        Observed  F. Movement:    Observed                   Score:          8/8  F. Breathing:   Observed ---------------------------------------------------------------------- Biometry  BPD:        81  mm     G. Age:  32w 4d         21  %    CI:        73.62   %    70 - 86                                                          FL/HC:      21.0   %    19.9 - 21.5  HC:      299.9  mm     G. Age:  33w 2d         13  %    HC/AC:      1.05        0.96 - 1.11  AC:      286.7  mm     G. Age:  32w 5d         31  %    FL/BPD:     77.9   %    71 - 87  FL:       63.1  mm     G. Age:  32w 5d         21  %    FL/AC:      22.0   %    20 - 24  HUM:      55.8  mm     G. Age:  32w 3d         38  %  Est. FW:    2034  gm      4 lb 8 oz     44  % ---------------------------------------------------------------------- Gestational Age  LMP:           34w 4d        Date:  03/21/17                 EDD:   12/26/17  U/S Today:     32w 6d                                        EDD:   01/07/18  Best:          33w 3d     Det. By:  U/S C R L  (06/26/17)    EDD:   01/03/18  ---------------------------------------------------------------------- Anatomy  Cranium:               Appears normal         Aortic Arch:            Previously seen  Cavum:  Appears normal         Ductal Arch:            Previously seen  Ventricles:            Appears normal         Diaphragm:              Appears normal  Choroid Plexus:        Previously seen        Stomach:                Appears normal, left                                                                        sided  Cerebellum:            Previously seen        Abdomen:                Appears normal  Posterior Fossa:       Previously seen        Abdominal Wall:         Previously seen  Nuchal Fold:           Previously seen        Cord Vessels:           Previously seen  Face:                  Orbits and profile     Kidneys:                Appear normal                         previously seen  Lips:                  Previously seen        Bladder:                Appears normal  Thoracic:              Appears normal         Spine:                  Previously seen  Heart:                 Appears normal         Upper Extremities:      Previously seen                         (4CH, axis, and situs  RVOT:                  Previously seen        Lower Extremities:      Previously seen  LVOT:                  Previously seen  Other:  Fetus appears to be a female prev seen. Heels prev visualized. Nasal          bone prev visualized. Technically difficult due to maternal habitus and  fetal position. ---------------------------------------------------------------------- Cervix Uterus Adnexa  Cervix  Not visualized (advanced GA >29wks) ---------------------------------------------------------------------- Impression  Single living intrauterine pregnancy at 33w 3d.  Cephalic presentation.  Placenta Posterior, above cervical os.  Normal amniotic fluid volume.  Appropriate interval fetal growth.  Normal interval fetal anatomy.  BPP  8/8. ---------------------------------------------------------------------- Recommendations  Continue antenatal testing and serial ultrasound for fetal  growth. ----------------------------------------------------------------------                   Darlyn Read, MD Electronically Signed Final Report   11/18/2017 11:12 am ----------------------------------------------------------------------  [place] Mfm Ob Follow Up  Result Date: 10/21/2017 ----------------------------------------------------------------------  OBSTETRICS REPORT                      (Signed Final 10/21/2017 11:11 am) ---------------------------------------------------------------------- Patient Info  ID #:       [id number]                          D.O.B.:  Aug 18, 1985 (32 yrs)  Name:       Laura Downs                 Visit Date: 10/21/2017 09:47 am ---------------------------------------------------------------------- Performed By  Performed By:     Fayne Norrie BS,      Ref. Address:     [address]                    [place]                                                             [place], [place]                                                             [address]  Attending:        Darlyn Read MD         Location:         [facility]  Referred By:      Mordecai Maes                    [facility]-                    Faculty Physician ---------------------------------------------------------------------- Orders   #  Description                                 Code   1  [place] MFM OB FOLLOW UP  16109.60  ----------------------------------------------------------------------   #  Ordered By               Order #        Accession #    Episode #   1  JEFFREY Marjo Bicker           [id number]      [id number]     [id number]  ---------------------------------------------------------------------- Indications   [redacted] weeks gestation of pregnancy                Z3A.29    Obesity complicating pregnancy, second         O99.212   trimester   Hypertension - Chronic/Pre-existing            O10.019  ---------------------------------------------------------------------- OB History  Blood Type:            Height:  5'5"   Weight (lb):  224       BMI:  37.27  Gravidity:    4         Term:   3        Prem:   0        SAB:   0  TOP:          0       Ectopic:  0        Living: 3 ---------------------------------------------------------------------- Fetal Evaluation  Num Of Fetuses:     1  Fetal Heart         136  Rate(bpm):  Cardiac Activity:   Observed  Presentation:       Cephalic  Placenta:           Posterior, above cervical os  P. Cord Insertion:  Previously Visualized  Amniotic Fluid  AFI FV:      Subjectively within normal limits  AFI Sum(cm)     %Tile       Largest Pocket(cm)  13.28           40          4.83  RUQ(cm)       RLQ(cm)       LUQ(cm)        LLQ(cm)  4.83          3.09          1.61           3.75 ---------------------------------------------------------------------- Biometry  BPD:      72.1  mm     G. Age:  29w 0d         23  %    CI:        71.92   %    70 - 86                                                          FL/HC:      21.0   %    19.6 - 20.8  HC:      270.6  mm     G. Age:  29w 4d         20  %    HC/AC:      1.10        0.99 - 1.21  AC:      246.1  mm  G. Age:  28w 6d         27  %    FL/BPD:     78.8   %    71 - 87  FL:       56.8  mm     G. Age:  29w 6d         46  %    FL/AC:      23.1   %    20 - 24  Est. FW:    1359  gm           3 lb     48  % ---------------------------------------------------------------------- Gestational Age  LMP:           30w 4d        Date:  03/21/17                 EDD:   12/26/17  U/S Today:     29w 2d                                        EDD:   01/04/18  Best:          29w 3d     Det. By:  U/S C R L  (06/26/17)    EDD:   01/03/18 ---------------------------------------------------------------------- Anatomy  Cranium:                Appears normal         Aortic Arch:            Previously seen  Cavum:                 Appears normal         Ductal Arch:            Previously seen  Ventricles:            Appears normal         Diaphragm:              Previously seen  Choroid Plexus:        Previously seen        Stomach:                Appears normal, left                                                                        sided  Cerebellum:            Previously seen        Abdomen:                Appears normal  Posterior Fossa:       Previously seen        Abdominal Wall:         Previously seen  Nuchal Fold:           Previously seen        Cord Vessels:           Previously seen  Face:  Orbits and profile     Kidneys:                Appear normal                         previously seen  Lips:                  Previously seen        Bladder:                Appears normal  Thoracic:              Appears normal         Spine:                  Previously seen  Heart:                 Previously seen        Upper Extremities:      Previously seen  RVOT:                  Previously seen        Lower Extremities:      Previously seen  LVOT:                  Previously seen  Other:  Fetus appears to be a female. Heels prev visualized. Nasal bone prev          visualized. Technically difficult due to maternal habitus and fetal          position. ---------------------------------------------------------------------- Cervix Uterus Adnexa  Cervix  Not visualized (advanced GA >29wks) ---------------------------------------------------------------------- Impression  Single living intrauterine pregnancy at 29w 3d.  Cephalic presentation.  Placenta Posterior, above cervical os.  Normal amniotic fluid volume.  Appropriate interval fetal growth.  Normal interval fetal anatomy. ---------------------------------------------------------------------- Recommendations  Continue serial ultrasounds for fetal growth.  Antenatal testing to begin  at 32 weeks. ----------------------------------------------------------------------                   Darlyn Read, MD Electronically Signed Final Report   10/21/2017 11:11 am ----------------------------------------------------------------------  US Fetal Bpp W/nonstress  Result Date: 11/18/2017 ----------------------------------------------------------------------  OBSTETRICS REPORT                      (Signed Final 11/18/2017 11:30 pm) ---------------------------------------------------------------------- Patient Info  ID #:       [id number]                          D.O.B.:  [birth year] (32 yrs)  Name:       Laura Downs                 Visit Date: 11/14/2017 10:56 am ---------------------------------------------------------------------- Performed By  Performed By:     Sedalia Muta Day [facility]          Ref. Address:     [address]                                                             [employer]  [place], [place]                                                             [id number]  Attending:        Willow [address] Bing MD     Location:         [facility] for                                                             [facility]  Referred By:      [facility]-                    Faculty Physician ---------------------------------------------------------------------- Orders   #  Description                                 Code   1  US FETAL BPP W/NONSTRESS                    66440.3  ----------------------------------------------------------------------   #  Ordered By               Order #        Accession #    Episode #   1  [place] Bing          [id number]      [id number]     [id number]  ---------------------------------------------------------------------- Service(s) Provided   US Fetal BPP W NST                                   [phone]   ---------------------------------------------------------------------- Indications   [redacted] weeks gestation of pregnancy                Z3A.32   Unspecified pre-existing hypertension          O10.913   complicating pregnancy, third trimester  ---------------------------------------------------------------------- OB History  Blood Type:            Height:  5'5"   Weight (lb):  224       BMI:  37.27  Gravidity:    4         Term:   3        Prem:   0        SAB:   0  TOP:          0       Ectopic:  0        Living: 3 ---------------------------------------------------------------------- Fetal  Evaluation  Num Of Fetuses:     1  Preg. Location:     Intrauterine  Cardiac Activity:   Observed  Presentation:       Cephalic  Amniotic Fluid  AFI FV:      Subjectively within normal limits  AFI Sum(cm)     %Tile       Largest Pocket(cm)  12.27           35          3.64  RUQ(cm)       RLQ(cm)       LUQ(cm)        LLQ(cm)  3.64          3.19          2.14           3.3 ---------------------------------------------------------------------- Biophysical Evaluation  Amniotic F.V:   Pocket => 2 cm two         F. Tone:        Observed                  planes  F. Movement:    Observed                   N.S.T:          Reactive  F. Breathing:   Not Observed               Score:          8/10 ---------------------------------------------------------------------- Gestational Age  LMP:           34w 0d        Date:  03/21/17                 EDD:   12/26/17  Best:          Laura Downs 6d     Det. By:  U/S C R L  (06/26/17)    EDD:   01/03/18 ---------------------------------------------------------------------- Impression  Reassuring antenatal testing. ---------------------------------------------------------------------- Recommendations  Continue with weekly testing. ----------------------------------------------------------------------                Farrell Bing, MD Electronically Signed Final Report   11/18/2017 11:30 pm  ----------------------------------------------------------------------   MAU Management/MDM: Vitals and nursing notes reviewed Orders Placed This Encounter  Procedures  . Urinalysis, Routine w reflex microscopic  . Discharge patient Discharge disposition: 01-Home or Self Care; Discharge patient date: 11/19/2017    Meds ordered this encounter  Medications  . butalbital-acetaminophen-caffeine (FIORICET, ESGIC) 50-325-40 MG per tablet 1 tablet  . butalbital-acetaminophen-caffeine (FIORICET, ESGIC) 50-325-40 MG tablet    Sig: Take 1-2 tablets by mouth every 6 (six) hours as needed for headache.    Dispense:  10 tablet    Refill:  0   Braxton-Hicks contractions. No signs of preterm labor. Cervix closed and thick. Headache is chronic for patient. Fioricet given with improvement in pain score to 0. BPs stable and wnl; no signs of preeclampsia.   Plan of care reviewed with patient, including labs and tests ordered and medical treatment.  Consult None.  Treatments in MAU included: PO hydration and Fioricet.   I personally reviewed the patient's NST today, found to be REACTIVE. 150 bpm, mod var, +accels, no decels. CTX: Irregular  ASSESSMENT 1. Braxton Hick's contraction   2. Supervision of high risk pregnancy, antepartum   3. Chronic hypertension during pregnancy, antepartum   4. Migraine without status migrainosus, not intractable, unspecified migraine type  PLAN Discharge home in stable condition. Rx Fioricet for headaches prn Counseled on return precautions Follow-up with OB provider Discussed preterm labor signs Handout given   Allergies as of 11/19/2017   No Known Allergies     Medication List    TAKE these medications   acetaminophen 500 MG tablet Commonly known as:  TYLENOL Take 500 mg by mouth every 6 (six) hours as needed for mild pain or headache.   aspirin EC 81 MG tablet Take 81 mg by mouth daily.   butalbital-acetaminophen-caffeine 50-325-40 MG  tablet Commonly known as:  FIORICET, ESGIC Take 1-2 tablets by mouth every 6 (six) hours as needed for headache.   cyclobenzaprine 10 MG tablet Commonly known as:  FLEXERIL Take 0.5-1 tablets (5-10 mg total) by mouth 3 (three) times daily as needed for muscle spasms.   sertraline 25 MG tablet Commonly known as:  ZOLOFT Take 1 tablet (25 mg total) by mouth daily.   VITAFOL GUMMIES 3.33-0.333-34.8 MG Chew Chew 1 tablet by mouth 2 (two) times daily.        Caryl Ada, DO OB Fellow [facility] for [facility], [facility]  11/19/2017, 11:31 AM

## 2017-11-19 NOTE — MAU Note (Signed)
Pt presents with c/o ctxs last night and this morning, H/A unrelieved with Tylenol, and lower back pain.  Denies VB or LOF.  Reports +FM.

## 2017-11-21 ENCOUNTER — Other Ambulatory Visit: Payer: Self-pay

## 2017-11-28 ENCOUNTER — Encounter: Payer: Self-pay | Admitting: Family Medicine

## 2017-11-28 ENCOUNTER — Ambulatory Visit: Payer: Self-pay

## 2017-11-28 ENCOUNTER — Ambulatory Visit (INDEPENDENT_AMBULATORY_CARE_PROVIDER_SITE_OTHER): Payer: Medicaid Other | Admitting: *Deleted

## 2017-11-28 ENCOUNTER — Encounter: Payer: Self-pay | Admitting: Obstetrics and Gynecology

## 2017-11-28 ENCOUNTER — Ambulatory Visit (INDEPENDENT_AMBULATORY_CARE_PROVIDER_SITE_OTHER): Payer: Medicaid Other | Admitting: Obstetrics and Gynecology

## 2017-11-28 VITALS — BP 114/67 | HR 101 | Wt 243.9 lb

## 2017-11-28 DIAGNOSIS — O10919 Unspecified pre-existing hypertension complicating pregnancy, unspecified trimester: Secondary | ICD-10-CM

## 2017-11-28 DIAGNOSIS — O0990 Supervision of high risk pregnancy, unspecified, unspecified trimester: Secondary | ICD-10-CM

## 2017-11-28 DIAGNOSIS — O10913 Unspecified pre-existing hypertension complicating pregnancy, third trimester: Secondary | ICD-10-CM

## 2017-11-28 DIAGNOSIS — Z3009 Encounter for other general counseling and advice on contraception: Secondary | ICD-10-CM | POA: Insufficient documentation

## 2017-11-28 DIAGNOSIS — Z029 Encounter for administrative examinations, unspecified: Secondary | ICD-10-CM

## 2017-11-28 NOTE — Progress Notes (Signed)
Pt encouraged to increase po water intake due to low normal amniotic fluid volume.

## 2017-11-28 NOTE — Progress Notes (Signed)
Subjective:  Laura Downs is a 32 y.o. [phone] at 6541w6d being seen today for ongoing prenatal care.  She is currently monitored for the following issues for this high-risk pregnancy and has Supervision of high risk pregnancy, antepartum; Chronic hypertension during pregnancy, antepartum; Depression; Migraines; Chronic benign essential hypertension, antepartum; Encounter for other antenatal screening follow-up; Obesity complicating pregnancy in second trimester; and Unwanted fertility on their problem list.  Patient reports no complaints.  Contractions: Irregular. Vag. Bleeding: None.  Movement: Present. Denies leaking of fluid.   The following portions of the patient's history were reviewed and updated as appropriate: allergies, current medications, past family history, past medical history, past social history, past surgical history and problem list. Problem list updated.  Objective:   Vitals:   11/28/17 0941  BP: 114/67  Pulse: (!) 101  Weight: 243 lb 14.4 oz (110.6 kg)    Fetal Status: Fetal Heart Rate (bpm): NST   Movement: Present     General:  Alert, oriented and cooperative. Patient is in no acute distress.  Skin: Skin is warm and dry. No rash noted.   Cardiovascular: Normal heart rate noted  Respiratory: Normal respiratory effort, no problems with respiration noted  Abdomen: Soft, gravid, appropriate for gestational age. Pain/Pressure: Present     Pelvic:  Cervical exam deferred        Extremities: Normal range of motion.     Mental Status: Normal mood and affect. Normal behavior. Normal judgment and thought content.   Urinalysis:      Assessment and Plan:  Pregnancy: G4P3003 at 9241w6d  1. Supervision of high risk pregnancy, antepartum Stable Cultures next visit  2. Chronic hypertension during pregnancy, antepartum BP stable without meds Continue with BASA BPP 8/10 today Growth scan 11/18/17     44 % Continue with antenatal testing IOL at 40 weeks if remains  stable  - US MFM FETAL BPP WO NON STRESS; Future  3. Unwanted fertility Paper signed   Preterm labor symptoms and general obstetric precautions including but not limited to vaginal bleeding, contractions, leaking of fluid and fetal movement were reviewed in detail with the patient. Please refer to After Visit Summary for other counseling recommendations.  Return in about 1 week (around 12/05/2017) for as scheduled; On 6/20 needs NST/BPP and HOB; On 6/27 needs HOB and NST only - has US @ 0830.   Hermina StaggersErvin, Michael L, MD

## 2017-11-28 NOTE — Progress Notes (Signed)
Pt had MAU visit on 5/22 due to H/A. Feeling better now - received Rx for Fioricet. [place] for growth done 5/21. Next [place] for growth/BPP scheduled 6/27

## 2017-11-28 NOTE — Patient Instructions (Signed)
Third Trimester of Pregnancy The third trimester is from week 28 through week 40 (months 7 through 9). The third trimester is a time when the unborn baby (fetus) is growing rapidly. At the end of the ninth month, the fetus is about 20 inches in length and weighs 6-10 pounds. Body changes during your third trimester Your body will continue to go through many changes during pregnancy. The changes vary from woman to woman. During the third trimester:  Your weight will continue to increase. You can expect to gain 25-35 pounds (11-16 kg) by the end of the pregnancy.  You may begin to get stretch marks on your hips, abdomen, and breasts.  You may urinate more often because the fetus is moving lower into your pelvis and pressing on your bladder.  You may develop or continue to have heartburn. This is caused by increased hormones that slow down muscles in the digestive tract.  You may develop or continue to have constipation because increased hormones slow digestion and cause the muscles that push waste through your intestines to relax.  You may develop hemorrhoids. These are swollen veins (varicose veins) in the rectum that can itch or be painful.  You may develop swollen, bulging veins (varicose veins) in your legs.  You may have increased body aches in the pelvis, back, or thighs. This is due to weight gain and increased hormones that are relaxing your joints.  You may have changes in your hair. These can include thickening of your hair, rapid growth, and changes in texture. Some women also have hair loss during or after pregnancy, or hair that feels dry or thin. Your hair will most likely return to normal after your baby is born.  Your breasts will continue to grow and they will continue to become tender. A yellow fluid (colostrum) may leak from your breasts. This is the first milk you are producing for your baby.  Your belly button may stick out.  You may notice more swelling in your hands,  face, or ankles.  You may have increased tingling or numbness in your hands, arms, and legs. The skin on your belly may also feel numb.  You may feel short of breath because of your expanding uterus.  You may have more problems sleeping. This can be caused by the size of your belly, increased need to urinate, and an increase in your body's metabolism.  You may notice the fetus "dropping," or moving lower in your abdomen (lightening).  You may have increased vaginal discharge.  You may notice your joints feel loose and you may have pain around your pelvic bone.  What to expect at prenatal visits You will have prenatal exams every 2 weeks until week 36. Then you will have weekly prenatal exams. During a routine prenatal visit:  You will be weighed to make sure you and the baby are growing normally.  Your blood pressure will be taken.  Your abdomen will be measured to track your baby's growth.  The fetal heartbeat will be listened to.  Any test results from the previous visit will be discussed.  You may have a cervical check near your due date to see if your cervix has softened or thinned (effaced).  You will be tested for Group B streptococcus. This happens between 35 and 37 weeks.  Your health care provider may ask you:  What your birth plan is.  How you are feeling.  If you are feeling the baby move.  If you have had   any abnormal symptoms, such as leaking fluid, bleeding, severe headaches, or abdominal cramping.  If you are using any tobacco products, including cigarettes, chewing tobacco, and electronic cigarettes.  If you have any questions.  Other tests or screenings that may be performed during your third trimester include:  Blood tests that check for low iron levels (anemia).  Fetal testing to check the health, activity level, and growth of the fetus. Testing is done if you have certain medical conditions or if there are problems during the  pregnancy.  Nonstress test (NST). This test checks the health of your baby to make sure there are no signs of problems, such as the baby not getting enough oxygen. During this test, a belt is placed around your belly. The baby is made to move, and its heart rate is monitored during movement.  What is false labor? False labor is a condition in which you feel small, irregular tightenings of the muscles in the womb (contractions) that usually go away with rest, changing position, or drinking water. These are called Braxton Hicks contractions. Contractions may last for hours, days, or even weeks before true labor sets in. If contractions come at regular intervals, become more frequent, increase in intensity, or become painful, you should see your health care provider. What are the signs of labor?  Abdominal cramps.  Regular contractions that start at 10 minutes apart and become stronger and more frequent with time.  Contractions that start on the top of the uterus and spread down to the lower abdomen and back.  Increased pelvic pressure and dull back pain.  A watery or bloody mucus discharge that comes from the vagina.  Leaking of amniotic fluid. This is also known as your "water breaking." It could be a slow trickle or a gush. Let your health care provider know if it has a color or strange odor. If you have any of these signs, call your health care provider right away, even if it is before your due date. Follow these instructions at home: Medicines  Follow your health care provider's instructions regarding medicine use. Specific medicines may be either safe or unsafe to take during pregnancy.  Take a prenatal vitamin that contains at least 600 micrograms (mcg) of folic acid.  If you develop constipation, try taking a stool softener if your health care provider approves. Eating and drinking  Eat a balanced diet that includes fresh fruits and vegetables, whole grains, good sources of protein  such as meat, eggs, or tofu, and low-fat dairy. Your health care provider will help you determine the amount of weight gain that is right for you.  Avoid raw meat and uncooked cheese. These carry germs that can cause birth defects in the baby.  If you have low calcium intake from food, talk to your health care provider about whether you should take a daily calcium supplement.  Eat four or five small meals rather than three large meals a day.  Limit foods that are high in fat and processed sugars, such as fried and sweet foods.  To prevent constipation: ? Drink enough fluid to keep your urine clear or pale yellow. ? Eat foods that are high in fiber, such as fresh fruits and vegetables, whole grains, and beans. Activity  Exercise only as directed by your health care provider. Most women can continue their usual exercise routine during pregnancy. Try to exercise for 30 minutes at least 5 days a week. Stop exercising if you experience uterine contractions.  Avoid heavy   lifting.  Do not exercise in extreme heat or humidity, or at high altitudes.  Wear low-heel, comfortable shoes.  Practice good posture.  You may continue to have sex unless your health care provider tells you otherwise. Relieving pain and discomfort  Take frequent breaks and rest with your legs elevated if you have leg cramps or low back pain.  Take warm sitz baths to soothe any pain or discomfort caused by hemorrhoids. Use hemorrhoid cream if your health care provider approves.  Wear a good support bra to prevent discomfort from breast tenderness.  If you develop varicose veins: ? Wear support pantyhose or compression stockings as told by your healthcare provider. ? Elevate your feet for 15 minutes, 3-4 times a day. Prenatal care  Write down your questions. Take them to your prenatal visits.  Keep all your prenatal visits as told by your health care provider. This is important. Safety  Wear your seat belt at  all times when driving.  Make a list of emergency phone numbers, including numbers for family, friends, the hospital, and police and fire departments. General instructions  Avoid cat litter boxes and soil used by cats. These carry germs that can cause birth defects in the baby. If you have a cat, ask someone to clean the litter box for you.  Do not travel far distances unless it is absolutely necessary and only with the approval of your health care provider.  Do not use hot tubs, steam rooms, or saunas.  Do not drink alcohol.  Do not use any products that contain nicotine or tobacco, such as cigarettes and e-cigarettes. If you need help quitting, ask your health care provider.  Do not use any medicinal herbs or unprescribed drugs. These chemicals affect the formation and growth of the baby.  Do not douche or use tampons or scented sanitary pads.  Do not cross your legs for long periods of time.  To prepare for the arrival of your baby: ? Take prenatal classes to understand, practice, and ask questions about labor and delivery. ? Make a trial run to the hospital. ? Visit the hospital and tour the maternity area. ? Arrange for maternity or paternity leave through employers. ? Arrange for family and friends to take care of pets while you are in the hospital. ? Purchase a rear-facing car seat and make sure you know how to install it in your car. ? Pack your hospital bag. ? Prepare the baby's nursery. Make sure to remove all pillows and stuffed animals from the baby's crib to prevent suffocation.  Visit your dentist if you have not gone during your pregnancy. Use a soft toothbrush to brush your teeth and be gentle when you floss. Contact a health care provider if:  You are unsure if you are in labor or if your water has broken.  You become dizzy.  You have mild pelvic cramps, pelvic pressure, or nagging pain in your abdominal area.  You have lower back pain.  You have persistent  nausea, vomiting, or diarrhea.  You have an unusual or bad smelling vaginal discharge.  You have pain when you urinate. Get help right away if:  Your water breaks before 37 weeks.  You have regular contractions less than 5 minutes apart before 37 weeks.  You have a fever.  You are leaking fluid from your vagina.  You have spotting or bleeding from your vagina.  You have severe abdominal pain or cramping.  You have rapid weight loss or weight gain.    You have shortness of breath with chest pain.  You notice sudden or extreme swelling of your face, hands, ankles, feet, or legs.  Your baby makes fewer than 10 movements in 2 hours.  You have severe headaches that do not go away when you take medicine.  You have vision changes. Summary  The third trimester is from week 28 through week 40, months 7 through 9. The third trimester is a time when the unborn baby (fetus) is growing rapidly.  During the third trimester, your discomfort may increase as you and your baby continue to gain weight. You may have abdominal, leg, and back pain, sleeping problems, and an increased need to urinate.  During the third trimester your breasts will keep growing and they will continue to become tender. A yellow fluid (colostrum) may leak from your breasts. This is the first milk you are producing for your baby.  False labor is a condition in which you feel small, irregular tightenings of the muscles in the womb (contractions) that eventually go away. These are called Braxton Hicks contractions. Contractions may last for hours, days, or even weeks before true labor sets in.  Signs of labor can include: abdominal cramps; regular contractions that start at 10 minutes apart and become stronger and more frequent with time; watery or bloody mucus discharge that comes from the vagina; increased pelvic pressure and dull back pain; and leaking of amniotic fluid. This information is not intended to replace advice  given to you by your health care provider. Make sure you discuss any questions you have with your health care provider. Document Released: 06/11/2001 Document Revised: 11/23/2015 Document Reviewed: 08/18/2012 Elsevier Interactive Patient Education  2017 [employer]  

## 2017-12-05 ENCOUNTER — Ambulatory Visit (INDEPENDENT_AMBULATORY_CARE_PROVIDER_SITE_OTHER): Payer: Medicaid Other | Admitting: *Deleted

## 2017-12-05 ENCOUNTER — Ambulatory Visit: Payer: Self-pay

## 2017-12-05 VITALS — BP 121/78 | HR 98 | Wt 243.6 lb

## 2017-12-05 DIAGNOSIS — O10919 Unspecified pre-existing hypertension complicating pregnancy, unspecified trimester: Secondary | ICD-10-CM

## 2017-12-05 DIAGNOSIS — O10913 Unspecified pre-existing hypertension complicating pregnancy, third trimester: Secondary | ICD-10-CM | POA: Diagnosis present

## 2017-12-05 NOTE — Progress Notes (Signed)

## 2017-12-11 ENCOUNTER — Encounter: Payer: Self-pay | Admitting: Obstetrics & Gynecology

## 2017-12-12 ENCOUNTER — Other Ambulatory Visit (HOSPITAL_COMMUNITY)
Admission: RE | Admit: 2017-12-12 | Discharge: 2017-12-12 | Disposition: A | Discharge status: Home / Self Care | Payer: Medicaid Other | Source: Ambulatory Visit | Attending: Obstetrics and Gynecology | Admitting: Obstetrics and Gynecology

## 2017-12-12 ENCOUNTER — Ambulatory Visit (INDEPENDENT_AMBULATORY_CARE_PROVIDER_SITE_OTHER): Payer: Medicaid Other | Admitting: Obstetrics and Gynecology

## 2017-12-12 ENCOUNTER — Ambulatory Visit: Payer: Self-pay

## 2017-12-12 ENCOUNTER — Ambulatory Visit (INDEPENDENT_AMBULATORY_CARE_PROVIDER_SITE_OTHER): Payer: Medicaid Other | Admitting: *Deleted

## 2017-12-12 VITALS — BP 121/70 | HR 102 | Wt 248.7 lb

## 2017-12-12 DIAGNOSIS — Z3A36 36 weeks gestation of pregnancy: Secondary | ICD-10-CM | POA: Insufficient documentation

## 2017-12-12 DIAGNOSIS — O10913 Unspecified pre-existing hypertension complicating pregnancy, third trimester: Secondary | ICD-10-CM

## 2017-12-12 DIAGNOSIS — O368130 Decreased fetal movements, third trimester, not applicable or unspecified: Secondary | ICD-10-CM

## 2017-12-12 DIAGNOSIS — O10919 Unspecified pre-existing hypertension complicating pregnancy, unspecified trimester: Secondary | ICD-10-CM

## 2017-12-12 DIAGNOSIS — O0993 Supervision of high risk pregnancy, unspecified, third trimester: Secondary | ICD-10-CM | POA: Diagnosis not present

## 2017-12-12 DIAGNOSIS — O0990 Supervision of high risk pregnancy, unspecified, unspecified trimester: Secondary | ICD-10-CM | POA: Diagnosis present

## 2017-12-12 LAB — POCT URINALYSIS DIP (DEVICE)
Bilirubin Urine: NEGATIVE
GLUCOSE, UA: NEGATIVE mg/dL
KETONES UR: NEGATIVE mg/dL
Leukocytes, UA: NEGATIVE
NITRITE: NEGATIVE
PH: 7 (ref 5.0–8.0)
Protein, ur: 30 mg/dL — AB
Specific Gravity, Urine: 1.025 (ref 1.005–1.030)
UROBILINOGEN UA: 1 mg/dL (ref 0.0–1.0)

## 2017-12-12 LAB — OB RESULTS CONSOLE GBS: GBS: NEGATIVE

## 2017-12-12 LAB — OB RESULTS CONSOLE GC/CHLAMYDIA: Gonorrhea: NEGATIVE

## 2017-12-12 NOTE — Progress Notes (Signed)
Pt reports decreased FM today. Next US for growth and BPP on 6/27

## 2017-12-12 NOTE — Progress Notes (Signed)
Prenatal Visit Note Date: 12/12/2017 Clinic: [facility]  Subjective:  Laura Downs is a 32 y.o. [phone] at 3732w6d being seen today for ongoing prenatal care.  She is currently monitored for the following issues for this high-risk pregnancy and has Supervision of high risk pregnancy, antepartum; Chronic hypertension during pregnancy, antepartum; Depression; Migraines; Chronic benign essential hypertension, antepartum; Encounter for other antenatal screening follow-up; Obesity complicating pregnancy in second trimester; and Unwanted fertility on their problem list.  Patient reports no complaints.   Contractions: Irregular. Vag. Bleeding: None.  Movement: (!) Decreased. Denies leaking of fluid.   The following portions of the patient's history were reviewed and updated as appropriate: allergies, current medications, past family history, past medical history, past social history, past surgical history and problem list. Problem list updated.  Objective:   Vitals:   12/12/17 0939  BP: 121/70  Pulse: (!) 102  Weight: 248 lb 11.2 oz (112.8 kg)    Fetal Status: Fetal Heart Rate (bpm): NST   Movement: (!) Decreased     General:  Alert, oriented and cooperative. Patient is in no acute distress.  Skin: Skin is warm and dry. No rash noted.   Cardiovascular: Normal heart rate noted  Respiratory: Normal respiratory effort, no problems with respiration noted  Abdomen: Soft, gravid, appropriate for gestational age. Pain/Pressure: Present     Pelvic:  Cervical exam performed Dilation: 1 Effacement (%): Thick Station: Ballotable  Extremities: Normal range of motion.     Mental Status: Normal mood and affect. Normal behavior. Normal judgment and thought content.   Urinalysis: Urine Protein: 1+    Assessment and Plan:  Pregnancy: G4P3003 at 4832w6d  1. Supervision of high risk pregnancy, antepartum Routine care. BTL papers UTD - GC/Chlamydia probe amp [facility] - Culture, beta strep (group b only)  2. Chronic hypertension during pregnancy, antepartum Doing well on no meds. bpp 10/10 today. Normal growth/ac on 5/21. Has rpt for 6/27. iol at 39-40wks  3. Decreased fetal movements in third trimester, single or unspecified fetus Reassuring ap testing today. fkc precautions given  Preterm labor symptoms and general obstetric precautions including but not limited to vaginal bleeding, contractions, leaking of fluid and fetal movement were reviewed in detail with the patient. Please refer to After Visit Summary for other counseling recommendations.  Return in about 5 days (around 12/17/2017) for as scheduled.   Barbour BingPickens, Robert Sunga, MD

## 2017-12-12 NOTE — Progress Notes (Signed)

## 2017-12-12 NOTE — Patient Instructions (Signed)

## 2017-12-13 LAB — GC/CHLAMYDIA PROBE AMP (~~LOC~~) NOT AT ARMC
CHLAMYDIA, DNA PROBE: NEGATIVE
NEISSERIA GONORRHEA: NEGATIVE

## 2017-12-16 ENCOUNTER — Ambulatory Visit (HOSPITAL_COMMUNITY): Payer: Medicaid Other

## 2017-12-16 LAB — CULTURE, BETA STREP (GROUP B ONLY): Strep Gp B Culture: NEGATIVE

## 2017-12-17 ENCOUNTER — Ambulatory Visit: Payer: Self-pay

## 2017-12-17 ENCOUNTER — Ambulatory Visit (INDEPENDENT_AMBULATORY_CARE_PROVIDER_SITE_OTHER): Payer: Medicaid Other | Admitting: *Deleted

## 2017-12-17 ENCOUNTER — Ambulatory Visit (INDEPENDENT_AMBULATORY_CARE_PROVIDER_SITE_OTHER): Payer: Medicaid Other | Admitting: Advanced Practice Midwife

## 2017-12-17 ENCOUNTER — Encounter: Payer: Self-pay | Admitting: Advanced Practice Midwife

## 2017-12-17 DIAGNOSIS — O10913 Unspecified pre-existing hypertension complicating pregnancy, third trimester: Secondary | ICD-10-CM

## 2017-12-17 DIAGNOSIS — O10919 Unspecified pre-existing hypertension complicating pregnancy, unspecified trimester: Secondary | ICD-10-CM

## 2017-12-17 DIAGNOSIS — O0990 Supervision of high risk pregnancy, unspecified, unspecified trimester: Secondary | ICD-10-CM

## 2017-12-17 DIAGNOSIS — O0993 Supervision of high risk pregnancy, unspecified, third trimester: Secondary | ICD-10-CM

## 2017-12-17 NOTE — Progress Notes (Signed)
   PRENATAL VISIT NOTE  Subjective:  Laura Downs is a 32 y.o. [phone] at 8372w4d being seen today for ongoing prenatal care.  She is currently monitored for the following issues for this low-risk pregnancy and has Supervision of high risk pregnancy, antepartum; Chronic hypertension during pregnancy, antepartum; Depression; Migraines; Chronic benign essential hypertension, antepartum; Encounter for other antenatal screening follow-up; Obesity complicating pregnancy in second trimester; and Unwanted fertility on their problem list.  Patient reports no complaints.  Contractions: Not present.  .  Movement: Present. Denies leaking of fluid.   The following portions of the patient's history were reviewed and updated as appropriate: allergies, current medications, past family history, past medical history, past social history, past surgical history and problem list. Problem list updated.  Objective:  There were no vitals filed for this visit.  Fetal Status: Fetal Heart Rate (bpm): NST   Movement: Present     General:  Alert, oriented and cooperative. Patient is in no acute distress.  Skin: Skin is warm and dry. No rash noted.   Cardiovascular: Normal heart rate noted  Respiratory: Normal respiratory effort, no problems with respiration noted  Abdomen: Soft, gravid, appropriate for gestational age.  Pain/Pressure: Absent     Pelvic: Cervical exam deferred        Extremities: Normal range of motion.     Mental Status: Normal mood and affect. Normal behavior. Normal judgment and thought content.    NST:  Baseline: 140 Variability: moderate Accels: 15x15 Decels: none Toco: none  BPP: 8/8. Plus NST = 10/10  Assessment and Plan:  Pregnancy: G4P3003 at 9072w4d  1. Chronic hypertension during pregnancy, antepartum - No Meds - Plan for IOL at 40 weeks - IOL scheduled for 01/03/18 at 0800 - Orders placed today  - Consider planning for FB placement Friday 01/02/18 depending on cervical exam closer  to time/date. Patient was 1cm on 6/14. Did not recheck her again today. Discussed outpatient FB procedure with patient, and that decision will be made at next visit.   2. Supervision of high risk pregnancy, antepartum - Fetal kick counts - Labor precautions - Pre-eclampsia warning signs reviewed    Term labor symptoms and general obstetric precautions including but not limited to vaginal bleeding, contractions, leaking of fluid and fetal movement were reviewed in detail with the patient. Please refer to After Visit Summary for other counseling recommendations.  Return in about 8 days (around 12/25/2017).  Future Appointments  Date Time Provider Department Center  12/25/2017  8:30 AM WH-MFC US 1 WH-MFCUS MFC-US  12/25/2017 10:15 AM [facility] NST [facility]A WOC  12/25/2017 11:15 AM Hermina StaggersErvin, Michael L, MD [facility] WOC  01/03/2018  8:00 AM [facility]-BSSCHED ROOM [facility]-B[facility]CHED None    Thressa ShellerHeather Charlis Harner, CNM   [facility] - O10.919  Group I   BP < 140/90, no preeclampsia, AGA,  nml AFV, +/- meds    Group II   BP > 140/90, on meds, no preeclampsia, AGA, nml AFV  20-28-34-38  20-24-28-32-35-38  33//2 x wk  28//BPP wkly then 33//2 x wk or BPP wkly  41 no meds; 39 meds  PRN or 37

## 2017-12-17 NOTE — Progress Notes (Signed)
Pt informed that the ultrasound is considered a limited OB ultrasound and is not intended to be a complete ultrasound exam.  Patient also informed that the ultrasound is not being completed with the intent of assessing for fetal or placental anomalies or any pelvic abnormalities.  Explained that the purpose of today's ultrasound is to assess for .  Patient acknowledges the purpose of the exam and the limitations of the study.    

## 2017-12-17 NOTE — Patient Instructions (Signed)

## 2017-12-17 NOTE — Progress Notes (Signed)
US for growth/BPP scheduled 6/27

## 2017-12-18 ENCOUNTER — Encounter: Payer: Self-pay | Admitting: Obstetrics and Gynecology

## 2017-12-19 ENCOUNTER — Encounter: Payer: Self-pay | Admitting: Obstetrics and Gynecology

## 2017-12-19 ENCOUNTER — Other Ambulatory Visit: Payer: Self-pay

## 2017-12-22 ENCOUNTER — Telehealth (HOSPITAL_COMMUNITY): Payer: Self-pay | Admitting: *Deleted

## 2017-12-22 NOTE — Telephone Encounter (Signed)
Preadmission screen  

## 2017-12-23 ENCOUNTER — Encounter (HOSPITAL_COMMUNITY): Payer: Self-pay | Admitting: *Deleted

## 2017-12-23 ENCOUNTER — Telehealth (HOSPITAL_COMMUNITY): Payer: Self-pay | Admitting: *Deleted

## 2017-12-23 NOTE — Telephone Encounter (Signed)
Preadmission screen  

## 2017-12-25 ENCOUNTER — Encounter (HOSPITAL_COMMUNITY): Payer: Self-pay

## 2017-12-25 ENCOUNTER — Ambulatory Visit (HOSPITAL_COMMUNITY)
Admission: RE | Admit: 2017-12-25 | Discharge: 2017-12-25 | Disposition: A | Discharge status: Home / Self Care | Payer: Medicaid Other | Source: Ambulatory Visit | Attending: Obstetrics and Gynecology | Admitting: Obstetrics and Gynecology

## 2017-12-25 ENCOUNTER — Other Ambulatory Visit: Payer: Self-pay | Admitting: Obstetrics and Gynecology

## 2017-12-25 ENCOUNTER — Encounter: Payer: Self-pay | Admitting: Obstetrics and Gynecology

## 2017-12-25 ENCOUNTER — Other Ambulatory Visit (HOSPITAL_COMMUNITY): Payer: Self-pay | Admitting: Maternal & Fetal Medicine

## 2017-12-25 ENCOUNTER — Ambulatory Visit (INDEPENDENT_AMBULATORY_CARE_PROVIDER_SITE_OTHER): Payer: Medicaid Other | Admitting: Obstetrics and Gynecology

## 2017-12-25 ENCOUNTER — Ambulatory Visit (INDEPENDENT_AMBULATORY_CARE_PROVIDER_SITE_OTHER): Payer: Medicaid Other | Admitting: General Practice

## 2017-12-25 VITALS — BP 107/73 | HR 93 | Wt 250.0 lb

## 2017-12-25 DIAGNOSIS — O99213 Obesity complicating pregnancy, third trimester: Secondary | ICD-10-CM

## 2017-12-25 DIAGNOSIS — Z3A38 38 weeks gestation of pregnancy: Secondary | ICD-10-CM

## 2017-12-25 DIAGNOSIS — O0990 Supervision of high risk pregnancy, unspecified, unspecified trimester: Secondary | ICD-10-CM

## 2017-12-25 DIAGNOSIS — Z362 Encounter for other antenatal screening follow-up: Secondary | ICD-10-CM | POA: Insufficient documentation

## 2017-12-25 DIAGNOSIS — O10919 Unspecified pre-existing hypertension complicating pregnancy, unspecified trimester: Secondary | ICD-10-CM

## 2017-12-25 DIAGNOSIS — O10013 Pre-existing essential hypertension complicating pregnancy, third trimester: Secondary | ICD-10-CM | POA: Diagnosis not present

## 2017-12-25 DIAGNOSIS — Z3009 Encounter for other general counseling and advice on contraception: Secondary | ICD-10-CM

## 2017-12-25 DIAGNOSIS — O10913 Unspecified pre-existing hypertension complicating pregnancy, third trimester: Secondary | ICD-10-CM

## 2017-12-25 DIAGNOSIS — O99212 Obesity complicating pregnancy, second trimester: Secondary | ICD-10-CM

## 2017-12-25 NOTE — Progress Notes (Signed)
Subjective:  Laura Downs is a 32 y.o. [phone] at 4131w5d being seen today for ongoing prenatal care.  She is currently monitored for the following issues for this high-risk pregnancy and has Supervision of high risk pregnancy, antepartum; Chronic hypertension during pregnancy, antepartum; Depression; Migraines; Chronic benign essential hypertension, antepartum; Encounter for other antenatal screening follow-up; Obesity complicating pregnancy in second trimester; and Unwanted fertility on their problem list.  Patient reports contractions since midmorning. NO VB or LOF.  Contractions: Irregular. Vag. Bleeding: None.  Movement: Present. Denies leaking of fluid.   The following portions of the patient's history were reviewed and updated as appropriate: allergies, current medications, past family history, past medical history, past social history, past surgical history and problem list. Problem list updated.  Objective:   Vitals:   12/25/17 1016  BP: 107/73  Pulse: 93  Weight: 250 lb (113.4 kg)    Fetal Status: Fetal Heart Rate (bpm): NST   Movement: Present     General:  Alert, oriented and cooperative. Patient is in no acute distress.  Skin: Skin is warm and dry. No rash noted.   Cardiovascular: Normal heart rate noted  Respiratory: Normal respiratory effort, no problems with respiration noted  Abdomen: Soft, gravid, appropriate for gestational age. Pain/Pressure: Present     Pelvic:  Cervical exam performed        Extremities: Normal range of motion.  Edema: None  Mental Status: Normal mood and affect. Normal behavior. Normal judgment and thought content.   Urinalysis:      Assessment and Plan:  Pregnancy: G4P3003 at 4531w5d  1. Chronic hypertension during pregnancy, antepartum BP stable without meds BPP with NST 8/10 ( -2 for breathing) Growth 61 % today Continue with antenatal testing IOL scheduled for 01/03/18  2. Supervision of high risk pregnancy, antepartum Stable Labor  precautions  3. Obesity complicating pregnancy in second trimester   4. Unwanted fertility BTL papers signed  Term labor symptoms and general obstetric precautions including but not limited to vaginal bleeding, contractions, leaking of fluid and fetal movement were reviewed in detail with the patient. Please refer to After Visit Summary for other counseling recommendations.  Return in about 1 week (around 01/01/2018) for OB visit.   Hermina StaggersErvin, Yukiko Minnich L, MD

## 2017-12-25 NOTE — Patient Instructions (Signed)
Vaginal Delivery Vaginal delivery means that you will give birth by pushing your baby out of your birth canal (vagina). A team of health care providers will help you before, during, and after vaginal delivery. Birth experiences are unique for every woman and every pregnancy, and birth experiences vary depending on where you choose to give birth. What should I do to prepare for my baby's birth? Before your baby is born, it is important to talk with your health care provider about:  Your labor and delivery preferences. These may include: ? Medicines that you may be given. ? How you will manage your pain. This might include non-medical pain relief techniques or injectable pain relief such as epidural analgesia. ? How you and your baby will be monitored during labor and delivery. ? Who may be in the labor and delivery room with you. ? Your feelings about surgical delivery of your baby (cesarean delivery, or C-section) if this becomes necessary. ? Your feelings about receiving donated blood through an IV tube (blood transfusion) if this becomes necessary.  Whether you are able: ? To take pictures or videos of the birth. ? To eat during labor and delivery. ? To move around, walk, or change positions during labor and delivery.  What to expect after your baby is born, such as: ? Whether delayed umbilical cord clamping and cutting is offered. ? Who will care for your baby right after birth. ? Medicines or tests that may be recommended for your baby. ? Whether breastfeeding is supported in your hospital or birth center. ? How long you will be in the hospital or birth center.  How any medical conditions you have may affect your baby or your labor and delivery experience.  To prepare for your baby's birth, you should also:  Attend all of your health care visits before delivery (prenatal visits) as recommended by your health care provider. This is important.  Prepare your home for your baby's  arrival. Make sure that you have: ? Diapers. ? Baby clothing. ? Feeding equipment. ? Safe sleeping arrangements for you and your baby.  Install a car seat in your vehicle. Have your car seat checked by a certified car seat installer to make sure that it is installed safely.  Think about who will help you with your new baby at home for at least the first several weeks after delivery.  What can I expect when I arrive at the birth center or hospital? Once you are in labor and have been admitted into the hospital or birth center, your health care provider may:  Review your pregnancy history and any concerns you have.  Insert an IV tube into one of your veins. This is used to give you fluids and medicines.  Check your blood pressure, pulse, temperature, and heart rate (vital signs).  Check whether your bag of water (amniotic sac) has broken (ruptured).  Talk with you about your birth plan and discuss pain control options.  Monitoring Your health care provider may monitor your contractions (uterine monitoring) and your baby's heart rate (fetal monitoring). You may need to be monitored:  Often, but not continuously (intermittently).  All the time or for long periods at a time (continuously). Continuous monitoring may be needed if: ? You are taking certain medicines, such as medicine to relieve pain or make your contractions stronger. ? You have pregnancy or labor complications.  Monitoring may be done by:  Placing a special stethoscope or a handheld monitoring device on your abdomen to   check your baby's heartbeat, and feeling your abdomen for contractions. This method of monitoring does not continuously record your baby's heartbeat or your contractions.  Placing monitors on your abdomen (external monitors) to record your baby's heartbeat and the frequency and length of contractions. You may not have to wear external monitors all the time.  Placing monitors inside of your uterus  (internal monitors) to record your baby's heartbeat and the frequency, length, and strength of your contractions. ? Your health care provider may use internal monitors if he or she needs more information about the strength of your contractions or your baby's heart rate. ? Internal monitors are put in place by passing a thin, flexible wire through your vagina and into your uterus. Depending on the type of monitor, it may remain in your uterus or on your baby's head until birth. ? Your health care provider will discuss the benefits and risks of internal monitoring with you and will ask for your permission before inserting the monitors.  Telemetry. This is a type of continuous monitoring that can be done with external or internal monitors. Instead of having to stay in bed, you are able to move around during telemetry. Ask your health care provider if telemetry is an option for you.  Physical exam Your health care provider may perform a physical exam. This may include:  Checking whether your baby is positioned: ? With the head toward your vagina (head-down). This is most common. ? With the head toward the top of your uterus (head-up or breech). If your baby is in a breech position, your health care provider may try to turn your baby to a head-down position so you can deliver vaginally. If it does not seem that your baby can be born vaginally, your provider may recommend surgery to deliver your baby. In rare cases, you may be able to deliver vaginally if your baby is head-up (breech delivery). ? Lying sideways (transverse). Babies that are lying sideways cannot be delivered vaginally.  Checking your cervix to determine: ? Whether it is thinning out (effacing). ? Whether it is opening up (dilating). ? How low your baby has moved into your birth canal.  What are the three stages of labor and delivery?  Normal labor and delivery is divided into the following three stages: Stage 1  Stage 1 is the  longest stage of labor, and it can last for hours or days. Stage 1 includes: ? Early labor. This is when contractions may be irregular, or regular and mild. Generally, early labor contractions are more than 10 minutes apart. ? Active labor. This is when contractions get longer, more regular, more frequent, and more intense. ? The transition phase. This is when contractions happen very close together, are very intense, and may last longer than during any other part of labor.  Contractions generally feel mild, infrequent, and irregular at first. They get stronger, more frequent (about every 2-3 minutes), and more regular as you progress from early labor through active labor and transition.  Many women progress through stage 1 naturally, but you may need help to continue making progress. If this happens, your health care provider may talk with you about: ? Rupturing your amniotic sac if it has not ruptured yet. ? Giving you medicine to help make your contractions stronger and more frequent.  Stage 1 ends when your cervix is completely dilated to 4 inches (10 cm) and completely effaced. This happens at the end of the transition phase. Stage 2  Once   your cervix is completely effaced and dilated to 4 inches (10 cm), you may start to feel an urge to push. It is common for the body to naturally take a rest before feeling the urge to push, especially if you received an epidural or certain other pain medicines. This rest period may last for up to 1-2 hours, depending on your unique labor experience.  During stage 2, contractions are generally less painful, because pushing helps relieve contraction pain. Instead of contraction pain, you may feel stretching and burning pain, especially when the widest part of your baby's head passes through the vaginal opening (crowning).  Your health care provider will closely monitor your pushing progress and your baby's progress through the vagina during stage 2.  Your  health care provider may massage the area of skin between your vaginal opening and anus (perineum) or apply warm compresses to your perineum. This helps it stretch as the baby's head starts to crown, which can help prevent perineal tearing. ? In some cases, an incision may be made in your perineum (episiotomy) to allow the baby to pass through the vaginal opening. An episiotomy helps to make the opening of the vagina larger to allow more room for the baby to fit through.  It is very important to breathe and focus so your health care provider can control the delivery of your baby's head. Your health care provider may have you decrease the intensity of your pushing, to help prevent perineal tearing.  After delivery of your baby's head, the shoulders and the rest of the body generally deliver very quickly and without difficulty.  Once your baby is delivered, the umbilical cord may be cut right away, or this may be delayed for 1-2 minutes, depending on your baby's health. This may vary among health care providers, hospitals, and birth centers.  If you and your baby are healthy enough, your baby may be placed on your chest or abdomen to help maintain the baby's temperature and to help you bond with each other. Some mothers and babies start breastfeeding at this time. Your health care team will dry your baby and help keep your baby warm during this time.  Your baby may need immediate care if he or she: ? Showed signs of distress during labor. ? Has a medical condition. ? Was born too early (prematurely). ? Had a bowel movement before birth (meconium). ? Shows signs of difficulty transitioning from being inside the uterus to being outside of the uterus. If you are planning to breastfeed, your health care team will help you begin a feeding. Stage 3  The third stage of labor starts immediately after the birth of your baby and ends after you deliver the placenta. The placenta is an organ that develops  during pregnancy to provide oxygen and nutrients to your baby in the womb.  Delivering the placenta may require some pushing, and you may have mild contractions. Breastfeeding can stimulate contractions to help you deliver the placenta.  After the placenta is delivered, your uterus should tighten (contract) and become firm. This helps to stop bleeding in your uterus. To help your uterus contract and to control bleeding, your health care provider may: ? Give you medicine by injection, through an IV tube, by mouth, or through your rectum (rectally). ? Massage your abdomen or perform a vaginal exam to remove any blood clots that are left in your uterus. ? Empty your bladder by placing a thin, flexible tube (catheter) into your bladder. ? Encourage   you to breastfeed your baby. After labor is over, you and your baby will be monitored closely to ensure that you are both healthy until you are ready to go home. Your health care team will teach you how to care for yourself and your baby. This information is not intended to replace advice given to you by your health care provider. Make sure you discuss any questions you have with your health care provider. Document Released: 03/26/2008 Document Revised: 01/05/2016 Document Reviewed: 07/02/2015 Elsevier Interactive Patient Education  2018 [employer]  

## 2017-12-25 NOTE — Addendum Note (Signed)
Encounter addended by: Emeline DarlingKiser, Jaylun Fleener E, RT on: 12/25/2017 10:02 AM  Actions taken: Imaging Exam ended

## 2017-12-26 ENCOUNTER — Other Ambulatory Visit: Payer: Self-pay

## 2017-12-26 ENCOUNTER — Encounter: Payer: Self-pay | Admitting: Obstetrics and Gynecology

## 2017-12-26 ENCOUNTER — Encounter (INDEPENDENT_AMBULATORY_CARE_PROVIDER_SITE_OTHER): Payer: Self-pay

## 2017-12-31 ENCOUNTER — Other Ambulatory Visit: Payer: Self-pay

## 2017-12-31 ENCOUNTER — Ambulatory Visit (INDEPENDENT_AMBULATORY_CARE_PROVIDER_SITE_OTHER): Payer: Medicaid Other | Admitting: *Deleted

## 2017-12-31 ENCOUNTER — Telehealth: Payer: Self-pay | Admitting: *Deleted

## 2017-12-31 ENCOUNTER — Encounter: Payer: Self-pay | Admitting: Obstetrics & Gynecology

## 2017-12-31 ENCOUNTER — Ambulatory Visit: Payer: Self-pay

## 2017-12-31 ENCOUNTER — Ambulatory Visit (INDEPENDENT_AMBULATORY_CARE_PROVIDER_SITE_OTHER): Payer: Medicaid Other | Admitting: Obstetrics & Gynecology

## 2017-12-31 VITALS — BP 122/78 | HR 94 | Wt 251.0 lb

## 2017-12-31 DIAGNOSIS — O10919 Unspecified pre-existing hypertension complicating pregnancy, unspecified trimester: Secondary | ICD-10-CM

## 2017-12-31 DIAGNOSIS — O0990 Supervision of high risk pregnancy, unspecified, unspecified trimester: Secondary | ICD-10-CM

## 2017-12-31 DIAGNOSIS — O10913 Unspecified pre-existing hypertension complicating pregnancy, third trimester: Secondary | ICD-10-CM | POA: Diagnosis not present

## 2017-12-31 NOTE — Progress Notes (Signed)

## 2017-12-31 NOTE — Progress Notes (Addendum)
   PRENATAL VISIT NOTE  Subjective:  Laura Downs is a 32 y.o. [phone] at 8324w4d being seen today for ongoing prenatal care.  She is currently monitored for the following issues for this high-risk pregnancy and has Supervision of high risk pregnancy, antepartum; Chronic hypertension during pregnancy, antepartum; Depression; Migraines; Obesity complicating pregnancy in second trimester; and Unwanted fertility on their problem list.  Patient reports no complaints.  Contractions: Irregular. Vag. Bleeding: None.  Movement: Present. Denies leaking of fluid.   The following portions of the patient's history were reviewed and updated as appropriate: allergies, current medications, past family history, past medical history, past social history, past surgical history and problem list. Problem list updated.  Objective:   Vitals:   12/31/17 1506  BP: 122/78  Pulse: 94  Weight: 251 lb (113.9 kg)    Fetal Status: Fetal Heart Rate (bpm): 154   Movement: Present  Presentation: Vertex  General:  Alert, oriented and cooperative. Patient is in no acute distress.  Skin: Skin is warm and dry. No rash noted.   Cardiovascular: Normal heart rate noted  Respiratory: Normal respiratory effort, no problems with respiration noted  Abdomen: Soft, gravid, appropriate for gestational age.  Pain/Pressure: Present     Pelvic: Cervical exam deferred Dilation: 1 Effacement (%): 50 Station: -3  Extremities: Normal range of motion.  Edema: None  Mental Status: Normal mood and affect. Normal behavior. Normal judgment and thought content.   Assessment and Plan:  Pregnancy: G4P3003 at 1924w4d  1. Chronic hypertension during pregnancy, antepartum Stable BP. NST performed today was reviewed and was found to be reactive. Subsequent BPP performed today was also reviewed and was found to be 8/10 (no sustained breathing). AFI was also normal.  Scheduled for IOL on 01/03/2018.   2. Supervision of high risk pregnancy,  antepartum Term labor symptoms and general obstetric precautions including but not limited to vaginal bleeding, contractions, leaking of fluid and fetal movement were reviewed in detail with the patient. Please refer to After Visit Summary for other counseling recommendations.  Return in about 12 days (around 01/12/2018) for BP Check    5 weeks: Postpartum Visit.  Future Appointments  Date Time Provider Department Center  01/03/2018  8:00 AM WH-BSSCHED ROOM WH-B[facility]CHED None    Jaynie CollinsUgonna Azadeh Hyder, MD

## 2017-12-31 NOTE — Telephone Encounter (Signed)
Pt called office and spoke w/Antoinette - registrar. She stated that she did not "feel like" coming in for her scheduled appts today @ 1415 (HOB [place]) and 1515 (NST/BPP) however she did want to schedule an appt for foley bulb insertion prior to IOL on 7/6. I reviewed pt EMR and then spoke with her. I stated that I am sorry she is not feeling well  but the scheduled appts for today are very important. We will be checking on her baby's health as well as organizing the plan of care for the foley bulb insertion. It is possible she may not need that procedure if her Cx has changed. We would only know this if she comes in and has a Cx exam. Therefore we need her to keep appts as scheduled today. Pt voiced understanding.

## 2017-12-31 NOTE — Patient Instructions (Addendum)
Skin to Skin After delivery, the staff will place your baby on your chest. This helps with the following: . Regulates baby's temperature, breathing, heart rate and blood sugar . Increases Mom's milk supply . Promotes bonding . Keeps baby and Mom calm and decreases baby's crying  Labor Induction Labor induction is when steps are taken to cause a pregnant woman to begin the labor process. Most women go into labor on their own between 37 weeks and 42 weeks of the pregnancy. When this does not happen or when there is a medical need, methods may be used to induce labor. Labor induction causes a pregnant woman's uterus to contract. It also causes the cervix to soften (ripen), open (dilate), and thin out (efface). Usually, labor is not induced before 39 weeks of the pregnancy unless there is a problem with the baby or mother. Before inducing labor, your health care provider will consider a number of factors, including the following:  The medical condition of you and the baby.  How many weeks along you are.  The status of the baby's lung maturity.  The condition of the cervix.  The position of the baby.  What are the reasons for labor induction? Labor may be induced for the following reasons:  The health of the baby or mother is at risk.  The pregnancy is overdue by 1 week or more.  The water breaks but labor does not start on its own.  The mother has a health condition or serious illness, such as high blood pressure, infection, placental abruption, or diabetes.  The amniotic fluid amounts are low around the baby.  The baby is distressed.  Convenience or wanting the baby to be born on a certain date is not a reason for inducing labor. What methods are used for labor induction? Several methods of labor induction may be used, such as:  Prostaglandin medicine. This medicine causes the cervix to dilate and ripen. The medicine will also start contractions. It can be taken by mouth or by  inserting a suppository into the vagina.  Inserting a thin tube (catheter) with a balloon on the end into the vagina to dilate the cervix. Once inserted, the balloon is expanded with water, which causes the cervix to open.  Stripping the membranes. Your health care provider separates amniotic sac tissue from the cervix, causing the cervix to be stretched and causing the release of a hormone called progesterone. This may cause the uterus to contract. It is often done during an office visit. You will be sent home to wait for the contractions to begin. You will then come in for an induction.  Breaking the water. Your health care provider makes a hole in the amniotic sac using a small instrument. Once the amniotic sac breaks, contractions should begin. This may still take hours to see an effect.  Medicine to trigger or strengthen contractions. This medicine is given through an IV access tube inserted into a vein in your arm.  All of the methods of induction, besides stripping the membranes, will be done in the hospital. Induction is done in the hospital so that you and the baby can be carefully monitored. How long does it take for labor to be induced? Some inductions can take up to 2-3 days. Depending on the cervix, it usually takes less time. It takes longer when you are induced early in the pregnancy or if this is your first pregnancy. If a mother is still pregnant and the induction has been going  on for 2-3 days, either the mother will be sent home or a cesarean delivery will be needed. What are the risks associated with labor induction? Some of the risks of induction include:  Changes in fetal heart rate, such as too high, too low, or erratic.  Fetal distress.  Chance of infection for the mother and baby.  Increased chance of having a cesarean delivery.  Breaking off (abruption) of the placenta from the uterus (rare).  Uterine rupture (very rare).  When induction is needed for medical  reasons, the benefits of induction may outweigh the risks. What are some reasons for not inducing labor? Labor induction should not be done if:  It is shown that your baby does not tolerate labor.  You have had previous surgeries on your uterus, such as a myomectomy or the removal of fibroids.  Your placenta lies very low in the uterus and blocks the opening of the cervix (placenta previa).  Your baby is not in a head-down position.  The umbilical cord drops down into the birth canal in front of the baby. This could cut off the baby's blood and oxygen supply.  You have had a previous cesarean delivery.  There are unusual circumstances, such as the baby being extremely premature.  This information is not intended to replace advice given to you by your health care provider. Make sure you discuss any questions you have with your health care provider. Document Released: 11/06/2006 Document Revised: 11/23/2015 Document Reviewed: 01/14/2013 Elsevier Interactive Patient Education  2017 [employer].

## 2018-01-03 ENCOUNTER — Other Ambulatory Visit: Payer: Self-pay

## 2018-01-03 ENCOUNTER — Inpatient Hospital Stay (HOSPITAL_COMMUNITY)
Admission: RE | Admit: 2018-01-03 | Discharge: 2018-01-05 | DRG: 798 | Disposition: A | Discharge status: Home / Self Care | Payer: Medicaid Other | Attending: Obstetrics and Gynecology | Admitting: Obstetrics and Gynecology

## 2018-01-03 ENCOUNTER — Inpatient Hospital Stay (HOSPITAL_COMMUNITY): Payer: Medicaid Other | Admitting: Anesthesiology

## 2018-01-03 ENCOUNTER — Encounter (HOSPITAL_COMMUNITY): Payer: Self-pay

## 2018-01-03 VITALS — BP 127/82 | HR 76 | Temp 98.5°F | Resp 16 | Ht 65.0 in | Wt 253.4 lb

## 2018-01-03 DIAGNOSIS — O0990 Supervision of high risk pregnancy, unspecified, unspecified trimester: Secondary | ICD-10-CM

## 2018-01-03 DIAGNOSIS — F329 Major depressive disorder, single episode, unspecified: Secondary | ICD-10-CM | POA: Diagnosis not present

## 2018-01-03 DIAGNOSIS — O9902 Anemia complicating childbirth: Secondary | ICD-10-CM | POA: Diagnosis present

## 2018-01-03 DIAGNOSIS — E669 Obesity, unspecified: Secondary | ICD-10-CM | POA: Diagnosis not present

## 2018-01-03 DIAGNOSIS — D649 Anemia, unspecified: Secondary | ICD-10-CM | POA: Diagnosis present

## 2018-01-03 DIAGNOSIS — Z3A40 40 weeks gestation of pregnancy: Secondary | ICD-10-CM

## 2018-01-03 DIAGNOSIS — Z3009 Encounter for other general counseling and advice on contraception: Secondary | ICD-10-CM

## 2018-01-03 DIAGNOSIS — Z302 Encounter for sterilization: Secondary | ICD-10-CM | POA: Diagnosis not present

## 2018-01-03 DIAGNOSIS — O1002 Pre-existing essential hypertension complicating childbirth: Secondary | ICD-10-CM | POA: Diagnosis not present

## 2018-01-03 DIAGNOSIS — O99214 Obesity complicating childbirth: Secondary | ICD-10-CM | POA: Diagnosis present

## 2018-01-03 DIAGNOSIS — O99344 Other mental disorders complicating childbirth: Secondary | ICD-10-CM | POA: Diagnosis present

## 2018-01-03 DIAGNOSIS — O10919 Unspecified pre-existing hypertension complicating pregnancy, unspecified trimester: Secondary | ICD-10-CM | POA: Diagnosis present

## 2018-01-03 DIAGNOSIS — O99212 Obesity complicating pregnancy, second trimester: Secondary | ICD-10-CM | POA: Diagnosis present

## 2018-01-03 DIAGNOSIS — F32A Depression, unspecified: Secondary | ICD-10-CM | POA: Diagnosis present

## 2018-01-03 DIAGNOSIS — Z9851 Tubal ligation status: Secondary | ICD-10-CM

## 2018-01-03 DIAGNOSIS — O134 Gestational [pregnancy-induced] hypertension without significant proteinuria, complicating childbirth: Secondary | ICD-10-CM

## 2018-01-03 LAB — CBC
HEMATOCRIT: 35.3 % — AB (ref 36.0–46.0)
HEMOGLOBIN: 11.8 g/dL — AB (ref 12.0–15.0)
MCH: 29.1 pg (ref 26.0–34.0)
MCHC: 33.4 g/dL (ref 30.0–36.0)
MCV: 86.9 fL (ref 78.0–100.0)
Platelets: 207 10*3/uL (ref 150–400)
RBC: 4.06 MIL/uL (ref 3.87–5.11)
RDW: 15.2 % (ref 11.5–15.5)
WBC: 7.3 10*3/uL (ref 4.0–10.5)

## 2018-01-03 LAB — TYPE AND SCREEN
ABO/RH(D): A POS
Antibody Screen: NEGATIVE

## 2018-01-03 MED ORDER — MISOPROSTOL 200 MCG PO TABS
ORAL_TABLET | ORAL | Status: AC
Start: 2018-01-03 — End: 2018-01-04
  Filled 2018-01-03: qty 5

## 2018-01-03 MED ORDER — ERYTHROMYCIN 5 MG/GM OP OINT
TOPICAL_OINTMENT | OPHTHALMIC | Status: AC
  Filled 2018-01-03: qty 1

## 2018-01-03 MED ORDER — OXYCODONE-ACETAMINOPHEN 5-325 MG PO TABS: 2.0000 | ORAL_TABLET | ORAL | Status: DC | PRN

## 2018-01-03 MED ORDER — LACTATED RINGERS IV SOLN
500.0000 mL | Freq: Once | INTRAVENOUS | Status: AC
  Administered 2018-01-03: 500 mL via INTRAVENOUS

## 2018-01-03 MED ORDER — LACTATED RINGERS IV SOLN: 500.0000 mL | INTRAVENOUS | Status: DC | PRN

## 2018-01-03 MED ORDER — SOD CITRATE-CITRIC ACID 500-334 MG/5ML PO SOLN: 30.0000 mL | ORAL | Status: DC | PRN

## 2018-01-03 MED ORDER — ONDANSETRON HCL 4 MG/2ML IJ SOLN
4.0000 mg | Freq: Four times a day (QID) | INTRAMUSCULAR | Status: DC | PRN
Start: 2018-01-03 — End: 2018-01-04
  Administered 2018-01-03: 4 mg via INTRAVENOUS
  Filled 2018-01-03: qty 2

## 2018-01-03 MED ORDER — OXYCODONE-ACETAMINOPHEN 5-325 MG PO TABS: 1.0000 | ORAL_TABLET | ORAL | Status: DC | PRN

## 2018-01-03 MED ORDER — EPHEDRINE 5 MG/ML INJ
10.0000 mg | INTRAVENOUS | Status: DC | PRN
  Filled 2018-01-03: qty 2

## 2018-01-03 MED ORDER — LIDOCAINE HCL (PF) 1 % IJ SOLN
30.0000 mL | INTRAMUSCULAR | Status: DC | PRN
  Administered 2018-01-03: 30 mL via SUBCUTANEOUS
  Filled 2018-01-03: qty 30

## 2018-01-03 MED ORDER — OXYTOCIN 40 UNITS IN LACTATED RINGERS INFUSION - SIMPLE MED
1.0000 m[IU]/min | INTRAVENOUS | Status: DC
  Administered 2018-01-03: 10 m[IU]/min via INTRAVENOUS
  Administered 2018-01-03: 2 m[IU]/min via INTRAVENOUS
  Filled 2018-01-03: qty 1000

## 2018-01-03 MED ORDER — MISOPROSTOL 200 MCG PO TABS
1000.0000 ug | ORAL_TABLET | Freq: Once | ORAL | Status: AC
  Administered 2018-01-03: 1000 ug via RECTAL

## 2018-01-03 MED ORDER — SOD CITRATE-CITRIC ACID 500-334 MG/5ML PO SOLN
ORAL | Status: AC
  Filled 2018-01-03: qty 15

## 2018-01-03 MED ORDER — PHENYLEPHRINE 40 MCG/ML (10ML) SYRINGE FOR IV PUSH (FOR BLOOD PRESSURE SUPPORT)
80.0000 ug | PREFILLED_SYRINGE | INTRAVENOUS | Status: DC | PRN
  Filled 2018-01-03: qty 5

## 2018-01-03 MED ORDER — FENTANYL 2.5 MCG/ML BUPIVACAINE 1/10 % EPIDURAL INFUSION (WH - ANES)
14.0000 mL/h | INTRAMUSCULAR | Status: DC | PRN
  Administered 2018-01-03: 14 mL/h via EPIDURAL
  Filled 2018-01-03: qty 100

## 2018-01-03 MED ORDER — OXYTOCIN BOLUS FROM INFUSION
500.0000 mL | Freq: Once | INTRAVENOUS | Status: AC
  Administered 2018-01-03: 500 mL via INTRAVENOUS

## 2018-01-03 MED ORDER — FENTANYL CITRATE (PF) 100 MCG/2ML IJ SOLN
100.0000 ug | INTRAMUSCULAR | Status: DC | PRN
  Administered 2018-01-03 (×3): 100 ug via INTRAVENOUS
  Filled 2018-01-03 (×3): qty 2

## 2018-01-03 MED ORDER — DIPHENHYDRAMINE HCL 50 MG/ML IJ SOLN: 12.5000 mg | INTRAMUSCULAR | Status: DC | PRN

## 2018-01-03 MED ORDER — LIDOCAINE HCL (PF) 1 % IJ SOLN
INTRAMUSCULAR | Status: DC | PRN
  Administered 2018-01-03: 13 mL via EPIDURAL

## 2018-01-03 MED ORDER — LACTATED RINGERS IV SOLN
INTRAVENOUS | Status: DC
  Administered 2018-01-03 (×2): via INTRAVENOUS

## 2018-01-03 MED ORDER — TERBUTALINE SULFATE 1 MG/ML IJ SOLN
0.2500 mg | Freq: Once | INTRAMUSCULAR | Status: DC | PRN
  Filled 2018-01-03: qty 1

## 2018-01-03 MED ORDER — OXYTOCIN 40 UNITS IN LACTATED RINGERS INFUSION - SIMPLE MED
2.5000 [IU]/h | INTRAVENOUS | Status: DC
  Administered 2018-01-03: 2.5 [IU]/h via INTRAVENOUS

## 2018-01-03 MED ORDER — ACETAMINOPHEN 325 MG PO TABS: 650.0000 mg | ORAL_TABLET | ORAL | Status: DC | PRN

## 2018-01-03 MED ORDER — PHENYLEPHRINE 40 MCG/ML (10ML) SYRINGE FOR IV PUSH (FOR BLOOD PRESSURE SUPPORT)
80.0000 ug | PREFILLED_SYRINGE | INTRAVENOUS | Status: DC | PRN
  Filled 2018-01-03: qty 5
  Filled 2018-01-03: qty 10

## 2018-01-03 NOTE — Anesthesia Preprocedure Evaluation (Signed)
Anesthesia Evaluation  Patient identified by MRN, date of birth, ID band Patient awake    Reviewed: Allergy & Precautions, NPO status , Patient's Chart, lab work & pertinent test results  Airway Mallampati: II  TM Distance: >3 FB Neck ROM: Full    Dental no notable dental hx.    Pulmonary neg pulmonary ROS,    Pulmonary exam normal breath sounds clear to auscultation       Cardiovascular hypertension, negative cardio ROS Normal cardiovascular exam Rhythm:Regular Rate:Normal     Neuro/Psych negative neurological ROS  negative psych ROS   GI/Hepatic negative GI ROS, Neg liver ROS,   Endo/Other  negative endocrine ROS  Renal/GU negative Renal ROS  negative genitourinary   Musculoskeletal negative musculoskeletal ROS (+)   Abdominal   Peds negative pediatric ROS (+)  Hematology negative hematology ROS (+)   Anesthesia Other Findings   Reproductive/Obstetrics negative OB ROS (+) Pregnancy                             Anesthesia Physical Anesthesia Plan  ASA: II  Anesthesia Plan: Epidural   Post-op Pain Management:    Induction:   PONV Risk Score and Plan:   Airway Management Planned:   Additional Equipment:   Intra-op Plan:   Post-operative Plan:   Informed Consent:   Plan Discussed with:   Anesthesia Plan Comments:         Anesthesia Quick Evaluation  

## 2018-01-03 NOTE — Anesthesia Pain Management Evaluation Note (Signed)
  CRNA Pain Management Visit Note  Patient: Laura Downs, 32 y.o., female  "Hello I am a member of the anesthesia team at [facility]. We have an anesthesia team available at all times to provide care throughout the hospital, including epidural management and anesthesia for C-section. I don't know your plan for the delivery whether it a natural birth, water birth, IV sedation, nitrous supplementation, doula or epidural, but we want to meet your pain goals."   1.Was your pain managed to your expectations on prior hospitalizations?   Yes   2.What is your expectation for pain management during this hospitalization?     Epidural  3.How can we help you reach that goal? Maintain epidural until delivery of infant.  Record the patient's initial score and the patient's pain goal.   Pain: 0  Pain Goal: 0 [facility] wants you to be able to say your pain was always managed very well.  [name] 01/03/2018

## 2018-01-03 NOTE — Anesthesia Procedure Notes (Signed)
Epidural Patient location during procedure: OB Start time: 01/03/2018 12:47 PM End time: 01/03/2018 1:07 PM  Staffing Anesthesiologist: Lowella CurbMiller, Janene Yousuf Ray, MD Performed: anesthesiologist   Preanesthetic Checklist Completed: patient identified, site marked, surgical consent, pre-op evaluation, timeout performed, IV checked, risks and benefits discussed and monitors and equipment checked  Epidural Patient position: sitting Prep: ChloraPrep Patient monitoring: heart rate, cardiac monitor, continuous pulse ox and blood pressure Approach: midline Location: L2-L3 Injection technique: LOR saline  Needle:  Needle type: Tuohy  Needle gauge: 17 G Needle length: 9 cm Needle insertion depth: 6 cm Catheter type: closed end flexible Catheter size: 20 Guage Catheter at skin depth: 10 cm Test dose: negative  Assessment Events: blood not aspirated, injection not painful, no injection resistance, negative IV test and no paresthesia  Additional Notes Reason for block:procedure for pain

## 2018-01-03 NOTE — Progress Notes (Deleted)
Patient ID: Laura Downs, female   DOB: 05/06/1986, 32 y.o.   MRN: [id number] OBSTETRIC ADMISSION HISTORY AND PHYSICAL  Laura Downs is a 32 y.o. female [id number] with IUP at 7331w0d by 12 week US [facility] She reports +FMs, No LOF, no VB, no blurry vision, headaches or peripheral edema, and RUQ pain.  She plans on breast/bottle feeding. She request BTL for birth control. [facility]   Dating: By 12 week US--->  Estimated Date of Delivery: 01/03/18  Sono:    @12  w 0 d, CWD, normal anatomy, CRL 65 mm   Prenatal History/Complications:  Past Medical History: Past Medical History:  Diagnosis Date  . Depression    current pregnancy  . Hypertension   . Vaginal Pap smear, abnormal     Past Surgical History: Past Surgical History:  Procedure Laterality Date  . COLPOSCOPY    . LEEP  2008   paps have been normal since.    Obstetrical History: OB History    Gravida  4   Para  3   Term  3   Preterm  0   AB  0   Living  3     SAB  0   TAB  0   Ectopic  0   Multiple  0   Live Births  3           Social History: Social History   Socioeconomic History  . Marital status: Single    Spouse name: Not on file  . Number of children: Not on file  . Years of education: Not on file  . Highest education level: Not on file  Occupational History  . Not on file  Social Needs  . Financial resource strain: Not on file  . Food insecurity:    Worry: Not on file    Inability: Not on file  . Transportation needs:    Medical: Not on file    Non-medical: Not on file  Tobacco Use  . Smoking status: Never Smoker  . Smokeless tobacco: Never Used  Substance and Sexual Activity  . Alcohol use: No  . Drug use: No  . Sexual activity: Not Currently    Partners: Male    Birth control/protection: None  Lifestyle  . Physical activity:    Days per week: Not on file    Minutes per session: Not on file  . Stress: Not on file   Relationships  . Social connections:    Talks on phone: Not on file    Gets together: Not on file    Attends religious service: Not on file    Active member of club or organization: Not on file    Attends meetings of clubs or organizations: Not on file    Relationship status: Not on file  Other Topics Concern  . Not on file  Social History Narrative  . Not on file    Family History: Family History  Problem Relation Age of Onset  . Diabetes Maternal Aunt   . Hypertension Maternal Grandmother     Allergies: No Known Allergies  Medications Prior to Admission  Medication Sig Dispense Refill Last Dose  . acetaminophen (TYLENOL) 500 MG tablet Take 500 mg by mouth every 6 (six) hours as needed for mild pain or headache.   Not Taking  . butalbital-acetaminophen-caffeine (FIORICET, ESGIC) 50-325-40 MG tablet Take 1-2 tablets by mouth every 6 (six) hours as needed for headache.  10 tablet 0 Taking  . Prenatal Vit-Fe Phos-FA-Omega (VITAFOL GUMMIES) 3.33-0.333-34.8 MG CHEW Chew 1 tablet by mouth 2 (two) times daily.   1 Taking     Review of Systems   All systems reviewed and negative except as stated in HPI  Blood pressure 131/84, pulse 87, temperature 98.1 F (36.7 C), temperature source Oral, resp. rate 16, height 5\' 5"  (1.651 m), weight 114.9 kg (253 lb 6.4 oz), last menstrual period 03/21/2017, unknown if currently breastfeeding. General appearance: alert, cooperative and no distress Lungs: clear to auscultation bilaterally Heart: regular rate and rhythm Abdomen: soft, non-tender; bowel sounds normal Pelvic: Dilation: 2 Effacement (%): 70 Station: -3 Exam by:: Gifford Shave RN  Extremities: Homans sign is negative, no sign of DVT Presentation: Vertex Fetal monitoringBaseline: 145 bpm, Variability: Good {> 6 bpm), Accelerations: Present and Decelerations: Variable: mild Uterine activityIntensity: Mild and Occasional Dilation: 2 Effacement (%): 70 Station: -3 Exam by::  Gifford Shave RN    Prenatal labs: ABO, Rh: --/--/A POS (07/06 1610) Antibody: NEG (07/06 0824) Rubella: Immune (01/14 0000) RPR: Non Reactive (04/17 0927)  HBsAg: Negative (01/14 0000)  HIV: Non Reactive (04/17 0927)  GBS: Negative (06/14 0000)  1 hr Glucola: Normal on 07/14/17 Genetic screening:  Normal on 06/26/17 Anatomy US: Normal on 12 week [place]  Prenatal Transfer Tool  Maternal Diabetes: No Genetic Screening: Normal Maternal Ultrasounds/Referrals: Normal Fetal Ultrasounds or other Referrals:  None Maternal Substance Abuse:  No Significant Maternal Medications:  None Significant Maternal Lab Results: None  Results for orders placed or performed during the hospital encounter of 01/03/18 (from the past 24 hour(s))  CBC   Collection Time: 01/03/18  8:24 AM  Result Value Ref Range   WBC 7.3 4.0 - 10.5 K/uL   RBC 4.06 3.87 - 5.11 MIL/uL   Hemoglobin 11.8 (L) 12.0 - 15.0 g/dL   HCT 96.0 (L) 45.4 - 09.8 %   MCV 86.9 78.0 - 100.0 fL   MCH 29.1 26.0 - 34.0 pg   MCHC 33.4 30.0 - 36.0 g/dL   RDW 11.9 14.7 - 82.9 %   Platelets 207 150 - 400 K/uL  Type and screen   Collection Time: 01/03/18  8:24 AM  Result Value Ref Range   ABO/RH(D) A POS    Antibody Screen NEG    Sample Expiration      01/06/2018 Performed at [facility], [address], [place], [place] [address]     Patient Active Problem List   Diagnosis Date Noted  . Chronic hypertension affecting pregnancy 01/03/2018  . Unwanted fertility 11/28/2017  . Obesity complicating pregnancy in second trimester   . Supervision of high risk pregnancy, antepartum 07/28/2017  . Chronic hypertension during pregnancy, antepartum 07/28/2017  . Migraines 07/28/2017  . Depression     Assessment/Plan:  Laura Downs is a 32 y.o. G4P3003 at [redacted]w[redacted]d here for IOL due to [facility].   #Labor: IOL for cHTN #Pain: Being controlled now with IV Fentanyl but once Foley bulb falls out, will get epidural #FWB: Category 1 #ID:  None  #MOF: Breast and Bottle #MOC:BTL  Rolland Bimler, Medical Student  01/03/2018, 11:01 AM

## 2018-01-03 NOTE — Progress Notes (Signed)
LABOR PROGRESS NOTE  Laura Downs is a 32 y.o. [phone] at 8010w0d  [facility] no medication for hypertension.   Subjective: Patient comfortable with epidural, feeling intermittent pressure but not constant in bottom   Objective: BP (!) 129/55   Pulse (!) 112   Temp 99.1 F (37.3 C) (Oral)   Resp 16   Ht 5\' 5"  (1.651 m)   Wt 114.9 kg (253 lb 6.4 oz)   LMP 03/21/2017   SpO2 100%   BMI 42.17 kg/m  or  Vitals:   01/03/18 1831 01/03/18 1902 01/03/18 1931 01/03/18 2001  BP: 123/71 (!) 122/59 123/63 (!) 129/55  Pulse: 100 95 (!) 113 (!) 112  Resp: 17 16 16 16   Temp:   99.1 F (37.3 C)   TempSrc:   Oral   SpO2:      Weight:      Height:        AROM @1541 - moderate meconium  Dilation: 8 Effacement (%): 90 Station: -1 Presentation: Vertex Exam by:: Gwendolyn GrantMadison Hicks, RN FHT: baseline rate 115, moderate varibility, +acel, no decel Toco: 2-4 minutes by palpation/ moderate   Labs: Lab Results  Component Value Date   WBC 7.3 01/03/2018   HGB 11.8 (Downs) 01/03/2018   HCT 35.3 (Downs) 01/03/2018   MCV 86.9 01/03/2018   PLT 207 01/03/2018    Patient Active Problem List   Diagnosis Date Noted  . Chronic hypertension affecting pregnancy 01/03/2018  . Unwanted fertility 11/28/2017  . Obesity complicating pregnancy in second trimester   . Supervision of high risk pregnancy, antepartum 07/28/2017  . Chronic hypertension during pregnancy, antepartum 07/28/2017  . Migraines 07/28/2017  . Depression     Assessment / Plan: 32 y.o. G4P3003 at 7210w0d here for IOL for CHTN   Labor: progressing well, AROM @1541 - moderate mec, continue pitocin titration Fetal Wellbeing:  Cat I Pain Control:  Epidural  Anticipated MOD:  SVD  cHTN: BP controlled, no signs or symptoms of pre-eclampsia  Ted McalpineFriedman, Laura Bossi L, MD 01/03/2018, 8:09 PM

## 2018-01-03 NOTE — Progress Notes (Signed)
LABOR PROGRESS NOTE  Laura Downs is a 32 y.o. [phone] at 5671w0d  [facility] for [facility].   Subjective: Patient comfortable with epidural, feeling intermittent pressure but not constant in bottom   Objective: BP 133/76   Pulse 86   Temp 97.8 F (36.6 C) (Oral)   Resp 16   Ht 5\' 5"  (1.651 m)   Wt 253 lb 6.4 oz (114.9 kg)   LMP 03/21/2017   SpO2 100%   BMI 42.17 kg/m  or  Vitals:   01/03/18 1400 01/03/18 1431 01/03/18 1500 01/03/18 1531  BP: 129/82 128/85 131/67 133/76  Pulse: 93 83 91 86  Resp: 17 16 16 16   Temp:    97.8 F (36.6 C)  TempSrc:    Oral  SpO2:      Weight:      Height:        AROM @1541 - moderate meconium  Dilation: 6 Effacement (%): 80 Station: -2 Presentation: Vertex Exam by:: Lanice ShirtsV. Aneesha Holloran CNM FHT: baseline rate 125, moderate varibility, +acel, no decel Toco: 2-4 minutes by palpation/ moderate   Labs: Lab Results  Component Value Date   WBC 7.3 01/03/2018   HGB 11.8 (L) 01/03/2018   HCT 35.3 (L) 01/03/2018   MCV 86.9 01/03/2018   PLT 207 01/03/2018    Patient Active Problem List   Diagnosis Date Noted  . Chronic hypertension affecting pregnancy 01/03/2018  . Unwanted fertility 11/28/2017  . Obesity complicating pregnancy in second trimester   . Supervision of high risk pregnancy, antepartum 07/28/2017  . Chronic hypertension during pregnancy, antepartum 07/28/2017  . Migraines 07/28/2017  . Depression     Assessment / Plan: 32 y.o. G4P3003 at 1071w0d here for IOL for CHTN   Labor: progressing well, AROM @1541 - moderate mec, continue pitocin titration Fetal Wellbeing:  Cat I Pain Control:  Epidural  Anticipated MOD:  SVD   Sharyon CableRogers, Coltyn Hanning C, CNM 01/03/2018, 4:08 PM

## 2018-01-03 NOTE — H&P (Signed)
Patient ID: Jan FiremanAlexis D Crotty, female   DOB: 01/14/1986, 32 y.o.   MRN: [id number] OBSTETRIC ADMISSION HISTORY AND PHYSICAL  Jan Firemanlexis D Canby is a 32 y.o. female [id number] with IUP at 4364w0d by 12 week US [facility] She reports +FMs, No LOF, no VB, no blurry vision, headaches or peripheral edema, and RUQ pain.  She plans on breast/bottle feeding. She request BTL for birth control. [facility]   Dating: By 12 week US--->  Estimated Date of Delivery: 01/03/18  Sono:    @12  w 0 d, CWD, normal anatomy, CRL 65 mm   Prenatal History/Complications:  Past Medical History: Past Medical History:  Diagnosis Date  . Depression    current pregnancy  . Hypertension   . Vaginal Pap smear, abnormal     Past Surgical History: Past Surgical History:  Procedure Laterality Date  . COLPOSCOPY    . LEEP  2008   paps have been normal since.    Obstetrical History: OB History    Gravida  4   Para  3   Term  3   Preterm  0   AB  0   Living  3     SAB  0   TAB  0   Ectopic  0   Multiple  0   Live Births  3           Social History: Social History   Socioeconomic History  . Marital status: Single    Spouse name: Not on file  . Number of children: Not on file  . Years of education: Not on file  . Highest education level: Not on file  Occupational History  . Not on file  Social Needs  . Financial resource strain: Not on file  . Food insecurity:    Worry: Not on file    Inability: Not on file  . Transportation needs:    Medical: Not on file    Non-medical: Not on file  Tobacco Use  . Smoking status: Never Smoker  . Smokeless tobacco: Never Used  Substance and Sexual Activity  . Alcohol use: No  . Drug use: No  . Sexual activity: Not Currently    Partners: Male    Birth control/protection: None  Lifestyle  . Physical activity:    Days per week: Not on file    Minutes per session: Not on file  . Stress: Not on file   Relationships  . Social connections:    Talks on phone: Not on file    Gets together: Not on file    Attends religious service: Not on file    Active member of club or organization: Not on file    Attends meetings of clubs or organizations: Not on file    Relationship status: Not on file  Other Topics Concern  . Not on file  Social History Narrative  . Not on file    Family History: Family History  Problem Relation Age of Onset  . Diabetes Maternal Aunt   . Hypertension Maternal Grandmother     Allergies: No Known Allergies  Medications Prior to Admission  Medication Sig Dispense Refill Last Dose  . acetaminophen (TYLENOL) 500 MG tablet Take 500 mg by mouth every 6 (six) hours as needed for mild pain or headache.   Not Taking  . butalbital-acetaminophen-caffeine (FIORICET, ESGIC) 50-325-40 MG tablet Take 1-2 tablets by mouth every 6 (six) hours as needed for headache.  10 tablet 0 Taking  . Prenatal Vit-Fe Phos-FA-Omega (VITAFOL GUMMIES) 3.33-0.333-34.8 MG CHEW Chew 1 tablet by mouth 2 (two) times daily.   1 Taking     Review of Systems   All systems reviewed and negative except as stated in HPI  Blood pressure 131/84, pulse 87, temperature 98.1 F (36.7 C), temperature source Oral, resp. rate 16, height 5\' 5"  (1.651 m), weight 114.9 kg (253 lb 6.4 oz), last menstrual period 03/21/2017, unknown if currently breastfeeding. General appearance: alert, cooperative and no distress Lungs: clear to auscultation bilaterally Heart: regular rate and rhythm Abdomen: soft, non-tender; bowel sounds normal Pelvic: Dilation: 2 Effacement (%): 70 Station: -3 Exam by:: Gifford Shave RN  Extremities: Homans sign is negative, no sign of DVT Presentation: Vertex Fetal monitoringBaseline: 145 bpm, Variability: Good {> 6 bpm), Accelerations: Present and Decelerations: Variable: mild Uterine activityIntensity: Mild and Occasional Dilation: 2 Effacement (%): 70 Station: -3 Exam by::  Gifford Shave RN    Prenatal labs: ABO, Rh: --/--/A POS (07/06 6045) Antibody: NEG (07/06 0824) Rubella: Immune (01/14 0000) RPR: Non Reactive (04/17 0927)  HBsAg: Negative (01/14 0000)  HIV: Non Reactive (04/17 0927)  GBS: Negative (06/14 0000)  1 hr Glucola: Normal on 07/14/17 Genetic screening:  Normal on 06/26/17 Anatomy US: Normal on 12 week [place]  Prenatal Transfer Tool  Maternal Diabetes: No Genetic Screening: Normal Maternal Ultrasounds/Referrals: Normal Fetal Ultrasounds or other Referrals:  None Maternal Substance Abuse:  No Significant Maternal Medications:  None Significant Maternal Lab Results: None  Results for orders placed or performed during the hospital encounter of 01/03/18 (from the past 24 hour(s))  CBC   Collection Time: 01/03/18  8:24 AM  Result Value Ref Range   WBC 7.3 4.0 - 10.5 K/uL   RBC 4.06 3.87 - 5.11 MIL/uL   Hemoglobin 11.8 (L) 12.0 - 15.0 g/dL   HCT 40.9 (L) 81.1 - 91.4 %   MCV 86.9 78.0 - 100.0 fL   MCH 29.1 26.0 - 34.0 pg   MCHC 33.4 30.0 - 36.0 g/dL   RDW 78.2 95.6 - 21.3 %   Platelets 207 150 - 400 K/uL  Type and screen   Collection Time: 01/03/18  8:24 AM  Result Value Ref Range   ABO/RH(D) A POS    Antibody Screen NEG    Sample Expiration      01/06/2018 Performed at [facility], [address] [place], [place] [address]     Patient Active Problem List   Diagnosis Date Noted  . Chronic hypertension affecting pregnancy 01/03/2018  . Unwanted fertility 11/28/2017  . Obesity complicating pregnancy in second trimester   . Supervision of high risk pregnancy, antepartum 07/28/2017  . Chronic hypertension during pregnancy, antepartum 07/28/2017  . Migraines 07/28/2017  . Depression     Assessment/Plan:  KAHLIYA FRALEIGH is a 32 y.o. G4P3003 at [redacted]w[redacted]d here for IOL due to [facility].   #Labor: IOL for cHTN #Pain: Being controlled now with IV Fentanyl but once Foley bulb falls out, will get epidural #FWB: Category 1 #ID:  None  #MOF: Breast and Bottle #MOC:BTL #cHTN: Well controlled without medications. Will continue to monitor throughout labor.   Rolland Bimler, Medical Student  01/03/2018, 11:01 AM

## 2018-01-03 NOTE — Progress Notes (Addendum)
Labor Progress Note  Laura Downs is a 32 y.o. [phone] at 6283w0d  admitted for induction of labor due to Chronic Hypertension.  S: Pt is tolerating early labor well. Foley bulb has come out as of 13:35. Epidural placed and is feeling no pain. Pt endorses HA and N/V. Denies any vision changes or swelling.    O:  BP 129/70   Pulse 100   Temp 97.6 F (36.4 C) (Oral)   Resp 16   Ht 5\' 5"  (1.651 m)   Wt 114.9 kg (253 lb 6.4 oz)   LMP 03/21/2017   SpO2 100%   BMI 42.17 kg/m   No intake/output data recorded.  FHT:  FHR: 125 bpm, variability: moderate,  accelerations:  Present,  decelerations:  Absent UC:   regular, every 3-4 minutes, with moderate intensity SVE:   Dilation: 5.5 Effacement (%): 80 Station: -3 Exam by:: Gifford ShaveYancey Luft RN  Membranes still intact  Pitocin @ 12 mu/min  Labs: Lab Results  Component Value Date   WBC 7.3 01/03/2018   HGB 11.8 (L) 01/03/2018   HCT 35.3 (L) 01/03/2018   MCV 86.9 01/03/2018   PLT 207 01/03/2018    Assessment / Plan: 32 y.o. G4P3003 2583w0d by 12 week US presenting for IOL due to cHTN. Induction of labor due to cHTN,  progressing well on pitocin  Labor: Progressing on Pitocin, will continue to increase then AROM and will consider AROM at next cervical check if membranes still intact. Fetal Wellbeing:  Category I Pain Control:  Epidural Anticipated MOD:  NSVD  CHTN: Well controlled without medications, did have two systolic readings in the 140's-150's around 13:00 likely 2/2 pain just prior to epidural but quickly recovered and have remained wnl since. Does endorse headache and nausea and vomiting so will just keep monitoring for any signs of pre-e throughout labor.  Expectant management  Ted McalpineFRIEDMAN, Tarshia Kot L Family Physician 01/03/18 1:58 PM

## 2018-01-04 ENCOUNTER — Inpatient Hospital Stay (HOSPITAL_COMMUNITY): Payer: Medicaid Other | Admitting: Anesthesiology

## 2018-01-04 ENCOUNTER — Other Ambulatory Visit: Payer: Self-pay

## 2018-01-04 ENCOUNTER — Encounter (HOSPITAL_COMMUNITY): Payer: Self-pay

## 2018-01-04 ENCOUNTER — Encounter (HOSPITAL_COMMUNITY)
Admission: RE | Disposition: A | Discharge status: Home / Self Care | Payer: Self-pay | Source: Home / Self Care | Attending: Obstetrics and Gynecology

## 2018-01-04 DIAGNOSIS — Z302 Encounter for sterilization: Secondary | ICD-10-CM

## 2018-01-04 HISTORY — PX: TUBAL LIGATION: SHX77

## 2018-01-04 LAB — CBC
HCT: 30.4 % — ABNORMAL LOW (ref 36.0–46.0)
Hemoglobin: 9.9 g/dL — ABNORMAL LOW (ref 12.0–15.0)
MCH: 28.7 pg (ref 26.0–34.0)
MCHC: 32.6 g/dL (ref 30.0–36.0)
MCV: 88.1 fL (ref 78.0–100.0)
PLATELETS: 189 10*3/uL (ref 150–400)
RBC: 3.45 MIL/uL — ABNORMAL LOW (ref 3.87–5.11)
RDW: 15.4 % (ref 11.5–15.5)
WBC: 14.7 10*3/uL — ABNORMAL HIGH (ref 4.0–10.5)

## 2018-01-04 LAB — RPR: RPR: NONREACTIVE

## 2018-01-04 SURGERY — LIGATION, FALLOPIAN TUBE, POSTPARTUM
Anesthesia: Epidural

## 2018-01-04 MED ORDER — ZOLPIDEM TARTRATE 5 MG PO TABS: 5.0000 mg | ORAL_TABLET | Freq: Every evening | ORAL | Status: DC | PRN

## 2018-01-04 MED ORDER — SODIUM CHLORIDE 0.9% FLUSH: 3.0000 mL | INTRAVENOUS | Status: DC | PRN

## 2018-01-04 MED ORDER — NALOXONE HCL 0.4 MG/ML IJ SOLN: 0.4000 mg | INTRAMUSCULAR | Status: DC | PRN

## 2018-01-04 MED ORDER — MEPERIDINE HCL 25 MG/ML IJ SOLN: 6.2500 mg | INTRAMUSCULAR | Status: DC | PRN

## 2018-01-04 MED ORDER — SCOPOLAMINE 1 MG/3DAYS TD PT72
1.0000 | MEDICATED_PATCH | Freq: Once | TRANSDERMAL | Status: DC
  Filled 2018-01-04: qty 1

## 2018-01-04 MED ORDER — PRENATAL MULTIVITAMIN CH
1.0000 | ORAL_TABLET | Freq: Every day | ORAL | Status: DC
  Administered 2018-01-05: 1 via ORAL
  Filled 2018-01-04: qty 1

## 2018-01-04 MED ORDER — OXYCODONE HCL 5 MG/5ML PO SOLN: 5.0000 mg | Freq: Once | ORAL | Status: DC | PRN

## 2018-01-04 MED ORDER — SIMETHICONE 80 MG PO CHEW: 80.0000 mg | CHEWABLE_TABLET | ORAL | Status: DC | PRN

## 2018-01-04 MED ORDER — IBUPROFEN 600 MG PO TABS
600.0000 mg | ORAL_TABLET | Freq: Four times a day (QID) | ORAL | Status: DC
  Administered 2018-01-04 – 2018-01-05 (×6): 600 mg via ORAL
  Filled 2018-01-04 (×6): qty 1

## 2018-01-04 MED ORDER — NALOXONE HCL 4 MG/10ML IJ SOLN
1.0000 ug/kg/h | INTRAVENOUS | Status: DC | PRN
  Filled 2018-01-04: qty 5

## 2018-01-04 MED ORDER — SENNOSIDES-DOCUSATE SODIUM 8.6-50 MG PO TABS
2.0000 | ORAL_TABLET | ORAL | Status: DC
  Administered 2018-01-04 (×2): 2 via ORAL
  Filled 2018-01-04 (×2): qty 2

## 2018-01-04 MED ORDER — NALBUPHINE HCL 10 MG/ML IJ SOLN: 5.0000 mg | INTRAMUSCULAR | Status: DC | PRN

## 2018-01-04 MED ORDER — DIPHENHYDRAMINE HCL 25 MG PO CAPS: 25.0000 mg | ORAL_CAPSULE | Freq: Four times a day (QID) | ORAL | Status: DC | PRN

## 2018-01-04 MED ORDER — METOCLOPRAMIDE HCL 10 MG PO TABS
10.0000 mg | ORAL_TABLET | Freq: Once | ORAL | Status: AC
  Administered 2018-01-04: 10 mg via ORAL
  Filled 2018-01-04: qty 1

## 2018-01-04 MED ORDER — BENZOCAINE-MENTHOL 20-0.5 % EX AERO: 1.0000 "application " | INHALATION_SPRAY | CUTANEOUS | Status: DC | PRN

## 2018-01-04 MED ORDER — ONDANSETRON HCL 4 MG PO TABS: 4.0000 mg | ORAL_TABLET | ORAL | Status: DC | PRN

## 2018-01-04 MED ORDER — OXYCODONE HCL 5 MG PO TABS
5.0000 mg | ORAL_TABLET | ORAL | Status: DC | PRN
  Administered 2018-01-04 (×2): 5 mg via ORAL
  Filled 2018-01-04 (×2): qty 1

## 2018-01-04 MED ORDER — ONDANSETRON HCL 4 MG/2ML IJ SOLN: 4.0000 mg | INTRAMUSCULAR | Status: DC | PRN

## 2018-01-04 MED ORDER — KETOROLAC TROMETHAMINE 30 MG/ML IJ SOLN: 30.0000 mg | Freq: Four times a day (QID) | INTRAMUSCULAR | Status: AC | PRN

## 2018-01-04 MED ORDER — NALBUPHINE HCL 10 MG/ML IJ SOLN: 5.0000 mg | Freq: Once | INTRAMUSCULAR | Status: DC | PRN

## 2018-01-04 MED ORDER — SODIUM BICARBONATE 8.4 % IV SOLN
INTRAVENOUS | Status: DC | PRN
  Administered 2018-01-04: 2 mL via EPIDURAL
  Administered 2018-01-04 (×2): 5 mL via EPIDURAL
  Administered 2018-01-04: 2 mL via EPIDURAL

## 2018-01-04 MED ORDER — KETOROLAC TROMETHAMINE 30 MG/ML IJ SOLN
30.0000 mg | Freq: Four times a day (QID) | INTRAMUSCULAR | Status: AC | PRN
  Administered 2018-01-04: 30 mg via INTRAVENOUS
  Filled 2018-01-04: qty 1

## 2018-01-04 MED ORDER — OXYCODONE HCL 5 MG PO TABS: 5.0000 mg | ORAL_TABLET | ORAL | Status: DC | PRN

## 2018-01-04 MED ORDER — BUPIVACAINE HCL (PF) 0.5 % IJ SOLN
INTRAMUSCULAR | Status: AC
  Filled 2018-01-04: qty 30

## 2018-01-04 MED ORDER — DIPHENHYDRAMINE HCL 50 MG/ML IJ SOLN: 12.5000 mg | INTRAMUSCULAR | Status: DC | PRN

## 2018-01-04 MED ORDER — ONDANSETRON HCL 4 MG/2ML IJ SOLN: 4.0000 mg | Freq: Four times a day (QID) | INTRAMUSCULAR | Status: DC | PRN

## 2018-01-04 MED ORDER — LACTATED RINGERS IV SOLN
INTRAVENOUS | Status: DC
  Administered 2018-01-04: 09:00:00 via INTRAVENOUS

## 2018-01-04 MED ORDER — WITCH HAZEL-GLYCERIN EX PADS: 1.0000 "application " | MEDICATED_PAD | CUTANEOUS | Status: DC | PRN

## 2018-01-04 MED ORDER — COCONUT OIL OIL: 1.0000 "application " | TOPICAL_OIL | Status: DC | PRN

## 2018-01-04 MED ORDER — BUPIVACAINE HCL (PF) 0.5 % IJ SOLN
INTRAMUSCULAR | Status: DC | PRN
  Administered 2018-01-04: 20 mL

## 2018-01-04 MED ORDER — ONDANSETRON HCL 4 MG/2ML IJ SOLN
INTRAMUSCULAR | Status: DC | PRN
  Administered 2018-01-04: 4 mg via INTRAVENOUS

## 2018-01-04 MED ORDER — OXYCODONE HCL 5 MG PO TABS: 5.0000 mg | ORAL_TABLET | Freq: Once | ORAL | Status: DC | PRN

## 2018-01-04 MED ORDER — DIBUCAINE 1 % RE OINT: 1.0000 "application " | TOPICAL_OINTMENT | RECTAL | Status: DC | PRN

## 2018-01-04 MED ORDER — ACETAMINOPHEN 325 MG PO TABS
650.0000 mg | ORAL_TABLET | ORAL | Status: DC | PRN
  Administered 2018-01-05: 650 mg via ORAL
  Filled 2018-01-04: qty 2

## 2018-01-04 MED ORDER — ONDANSETRON HCL 4 MG/2ML IJ SOLN: 4.0000 mg | Freq: Three times a day (TID) | INTRAMUSCULAR | Status: DC | PRN

## 2018-01-04 MED ORDER — DIPHENHYDRAMINE HCL 25 MG PO CAPS: 25.0000 mg | ORAL_CAPSULE | ORAL | Status: DC | PRN

## 2018-01-04 MED ORDER — FENTANYL CITRATE (PF) 100 MCG/2ML IJ SOLN: 25.0000 ug | INTRAMUSCULAR | Status: DC | PRN

## 2018-01-04 MED ORDER — FAMOTIDINE 20 MG PO TABS
40.0000 mg | ORAL_TABLET | Freq: Once | ORAL | Status: AC
  Administered 2018-01-04: 40 mg via ORAL
  Filled 2018-01-04: qty 2

## 2018-01-04 SURGICAL SUPPLY — 23 items
CHLORAPREP W/TINT 26ML (MISCELLANEOUS) ×2 IMPLANT
CLOTH BEACON ORANGE TIMEOUT ST (SAFETY) ×2 IMPLANT
DRESSING OPSITE X SMALL 2X3 (GAUZE/BANDAGES/DRESSINGS) ×2 IMPLANT
DRSG OPSITE POSTOP 3X4 (GAUZE/BANDAGES/DRESSINGS) ×2 IMPLANT
GLOVE BIOGEL PI IND STRL 6.5 (GLOVE) ×1 IMPLANT
GLOVE BIOGEL PI IND STRL 7.0 (GLOVE) ×1 IMPLANT
GLOVE BIOGEL PI INDICATOR 6.5 (GLOVE) ×1
GLOVE BIOGEL PI INDICATOR 7.0 (GLOVE) ×1
GLOVE ORTHOPEDIC STR SZ6.5 (GLOVE) ×2 IMPLANT
GOWN STRL REUS W/TWL LRG LVL3 (GOWN DISPOSABLE) ×4 IMPLANT
NEEDLE HYPO 22GX1.5 SAFETY (NEEDLE) ×2 IMPLANT
NS IRRIG 1000ML POUR BTL (IV SOLUTION) ×2 IMPLANT
PACK ABDOMINAL MINOR (CUSTOM PROCEDURE TRAY) ×2 IMPLANT
SPONGE LAP 4X18 X RAY DECT (DISPOSABLE) ×2 IMPLANT
SUT MON AB 4-0 PS1 27 (SUTURE) ×2 IMPLANT
SUT PLAIN 0 NONE (SUTURE) ×4 IMPLANT
SUT PLAIN 2 0 (SUTURE) ×1
SUT PLAIN ABS 2-0 CT1 27XMFL (SUTURE) ×1 IMPLANT
SUT VICRYL 0 UR6 27IN ABS (SUTURE) ×2 IMPLANT
SYR CONTROL 10ML LL (SYRINGE) ×2 IMPLANT
TOWEL OR 17X24 6PK STRL BLUE (TOWEL DISPOSABLE) ×4 IMPLANT
TRAY FOLEY CATH SILVER 14FR (SET/KITS/TRAYS/PACK) ×2 IMPLANT
WATER STERILE IRR 1000ML POUR (IV SOLUTION) ×2 IMPLANT

## 2018-01-04 NOTE — Transfer of Care (Signed)
Immediate Anesthesia Transfer of Care Note  Patient: Laura Downs  Procedure(s) Performed: POST PARTUM TUBAL LIGATION (N/A )  Patient Location: PACU  Anesthesia Type:Epidural  Level of Consciousness: awake, alert , oriented and patient cooperative  Airway & Oxygen Therapy: Patient Spontanous Breathing and Patient connected to nasal cannula oxygen  Post-op Assessment: Report given to RN and Post -op Vital signs reviewed and stable  Post vital signs: Reviewed and stable  Last Vitals:  Vitals Value Taken Time  BP    Temp    Pulse 87 01/04/2018 10:09 AM  Resp 15 01/04/2018 10:09 AM  SpO2 90 % 01/04/2018 10:09 AM  Vitals shown include unvalidated device data.  Last Pain:  Vitals:   01/04/18 0819  TempSrc: Oral  PainSc:          Complications: No apparent anesthesia complications

## 2018-01-04 NOTE — Lactation Note (Signed)
This note was copied from a baby's chart. Lactation Consultation Note  Patient Name: Laura Percell Beltlexis Cantu ZOXWR'UToday's Date: 01/04/2018 Reason for consult: Follow-up assessment;Difficult latch Mom called out for formula.  I asked her if she would like breastfeeding assist and she was agreeable.  Mom wearing shells.  Nipples evert but short.  Baby positioned in football hold on right side.  She would not open mouth and showed no signs of hunger.  20 mm nipple shield applied but still no suck elicited.  Reviewed first 24 hour behavior and reassured mom.  Baby placed skin to skin on mom's chest and instructed to watch for cues.  Encouraged to call for assist prn.  Manual pump at bedside.  Maternal Data Has patient been taught Hand Expression?: Yes Does the patient have breastfeeding experience prior to this delivery?: No  Feeding Feeding Type: Breast Fed  LATCH Score Latch: Too sleepy or reluctant, no latch achieved, no sucking elicited.  Audible Swallowing: None  Type of Nipple: Everted at rest and after stimulation(short)  Comfort (Breast/Nipple): Soft / non-tender  Hold (Positioning): Assistance needed to correctly position infant at breast and maintain latch.  LATCH Score: 5  Interventions Interventions: Assisted with latch;Breast compression;Skin to skin;Adjust position;Breast massage;Support pillows;Hand express;Position options;Hand pump;Shells  Lactation Tools Discussed/Used Tools: Shells;Nipple Shields Nipple shield size: 20 Shell Type: Inverted   Consult Status Consult Status: Follow-up Date: 01/05/18 Follow-up type: In-patient    Huston FoleyMOULDEN, Megann Easterwood S 01/04/2018, 3:12 PM

## 2018-01-04 NOTE — Progress Notes (Signed)
Patient ID: Laura Downs, female   DOB: 12/29/1985, 32 y.o.   MRN: [id number] POSTPARTUM PROGRESS NOTE  Post Partum Day 1 Subjective:  Laura Downs is a 32 y.o. [id number] 3619w0d s/p IOL and SVD.  Pt has a hx of cHTN and her blood pressures have been ranging from mostly the 120s-130s.  No acute events overnight. She denies any headaches or blurry vision.  Pt denies problems with ambulating, voiding or po intake.  She denies nausea or vomiting. Patient denies any cramping.  Pain is well controlled. Lochia Large. Patient states she has used 4-5 pads postpartum. She has been NPO since midnight. Patient is having some difficulties with infant latching due to inverted nipples and plans to try breast shells.  Objective: Blood pressure 131/88, pulse 96, temperature 98.8 F (37.1 C), temperature source Oral, resp. rate 18, height 5\' 5"  (1.651 m), weight 114.9 kg (253 lb 6.4 oz), last menstrual period 03/21/2017, SpO2 95 %, unknown if currently breastfeeding.  Physical Exam:  General: alert, cooperative and no distress Lochia:normal flow Chest: no respiratory distress Heart:regular rate, distal pulses intact Abdomen: soft, nontender,  Uterine Fundus: firm, appropriately tender DVT Evaluation: No calf swelling or tenderness Extremities: no edema  Recent Labs    01/03/18 0824 01/04/18 0547  HGB 11.8* 9.9*  HCT 35.3* 30.4*    Assessment/Plan:  ASSESSMENT: Laura Downs is a 32 y.o. [id number] 4519w0d s/p IOL and SVD at 2130  Breastfeeding, infant is currently using formula as well Contraception: BTL before discharge. Patient educated on procedure and risks    LOS: 1 day   Sable FeilLindsey R MitchellDO 01/04/2018, 8:47 AM

## 2018-01-04 NOTE — Op Note (Signed)
   Laura Downs 01/03/2018 - 01/04/2018  PREOPERATIVE DIAGNOSES: Multiparity, undesired fertility  POSTOPERATIVE DIAGNOSES: Multiparity, undesired fertility  PROCEDURE:  Postpartum Bilateral Tubal Sterilization via Modified Pomeroy fashion   SURGEON: Baldemar LenisK. Meryl Davis, MD  ANESTHESIA:  Epidural and local analgesia using 20 ml of 0.025% Marcaine  COMPLICATIONS:  None immediate.  ESTIMATED BLOOD LOSS: 15 ml.  FLUIDS: 600 ml LR.  URINE OUTPUT:  50 ml of clear urine.  INDICATIONS:  32 y.o. [id number] with undesired fertility,status post vaginal delivery, desires permanent sterilization.  Other reversible forms of contraception were discussed with patient; she declines all other modalities. Risks of procedure discussed with patient including but not limited to: risk of regret, permanence of method, bleeding, infection, injury to surrounding organs and need for additional procedures.  Failure risk of 1 -2 % with increased risk of ectopic gestation if pregnancy occurs was also discussed with patient.      FINDINGS:  Normal uterus, tubes, and ovaries.  PROCEDURE DETAILS: The patient was taken to the operating room where her epidural anesthesia was dosed up to surgical level and found to be adequate.  She was then placed in the dorsal supine position and prepped and draped in sterile fashion.  After an adequate timeout was performed, attention was turned to the patient's abdomen where a 4 cm transverse skin incision was made inferior to the umbilicus. The incision was taken down to the layer of fascia using the scalpel, and fascia was incised, and extended bilaterally using Mayo scissors. The peritoneum was entered sharply and the uterus and tubes were visualized.     Attention was then turned to the patient's uterus, and the left fallopian tube was clamped using a babcock and followed out to its fimbriated end. A 3 cm portion of the mid-portion of the fallopian tube was grasped with babcock graspers and  elevated. The elevated loop of tube was tied off with 2-0 plain gut. A second tie was placed under the first tie and a 2 cm loop of tube was excised by bluntly creating a hole in the mesosalpinx under the tied off portion above the suture and excising each end of tube with the metzenbaum scissors via a modified pomeroy fashion.  The remaining pedicles of the tube were inspected and one of the pedicles was noted to have slipped from the free tie. This pedicle was grasped and re-ligated with a free tie of 2-0 plain gut x2  and found to be hemostatic. The right fallopian tube was visualized and followed out to the fimbriated end.  A similar process was carried out for the right fallopian tube and hemostasis noted at the pedicles. The left pedicle was again inspected and no bleeding noted from the left pedicles. Good hemostasis was noted overall. The instruments were then removed from the patient's abdomen and the fascial incision was repaired with 0 Vicryl, the subcutaneous tissue was closed with 2-0 plain and the skin was closed with a 4-0 Monocryl subcuticular stitch. Marcaine 0.025% was injected around the incision. The patient tolerated the procedure well.  Instrument, sponge, and needle counts were correct times two.  The patient was then taken to the recovery room awake and in stable condition.   Baldemar LenisK. Meryl Davis, M.D. Attending Obstetrician & Gynecologist, [facility] for [facility], [facility]

## 2018-01-04 NOTE — Anesthesia Postprocedure Evaluation (Signed)
Anesthesia Post Note  Patient: Laura Downs  Procedure(s) Performed: AN AD HOC LABOR EPIDURAL     Patient location during evaluation: Mother Baby Anesthesia Type: Epidural Level of consciousness: awake and alert and oriented Pain management: satisfactory to patient Vital Signs Assessment: post-procedure vital signs reviewed and stable Respiratory status: spontaneous breathing and nonlabored ventilation Cardiovascular status: stable Postop Assessment: no headache, no backache, no signs of nausea or vomiting, adequate PO intake and patient able to bend at knees (patient up walking) Anesthetic complications: no    Last Vitals:  Vitals:   01/04/18 0119 01/04/18 0609  BP: (!) 141/75 117/73  Pulse: 83 91  Resp: 18 18  Temp: 37 C 37.2 C  SpO2:      Last Pain:  Vitals:   01/04/18 0705  TempSrc:   PainSc: Asleep   Pain Goal:                 Madison HickmanGREGORY,Estanislado Surgeon

## 2018-01-04 NOTE — Anesthesia Preprocedure Evaluation (Signed)
Anesthesia Evaluation  Patient identified by MRN, date of birth, ID band Patient awake    Reviewed: Allergy & Precautions, H&P , NPO status , Patient's Chart, lab work & pertinent test results  Airway Mallampati: II   Neck ROM: full    Dental   Pulmonary neg pulmonary ROS,    breath sounds clear to auscultation       Cardiovascular hypertension,  Rhythm:regular Rate:Normal     Neuro/Psych  Headaches, PSYCHIATRIC DISORDERS Depression    GI/Hepatic   Endo/Other    Renal/GU      Musculoskeletal   Abdominal   Peds  Hematology  (+) anemia ,   Anesthesia Other Findings   Reproductive/Obstetrics                             Anesthesia Physical Anesthesia Plan  ASA: II  Anesthesia Plan: Epidural   Post-op Pain Management:    Induction: Intravenous  PONV Risk Score and Plan: 2 and Ondansetron and Treatment may vary due to age or medical condition  Airway Management Planned: Nasal Cannula  Additional Equipment:   Intra-op Plan:   Post-operative Plan:   Informed Consent: I have reviewed the patients History and Physical, chart, labs and discussed the procedure including the risks, benefits and alternatives for the proposed anesthesia with the patient or authorized representative who has indicated his/her understanding and acceptance.     Plan Discussed with: CRNA, Anesthesiologist and Surgeon  Anesthesia Plan Comments:         Anesthesia Quick Evaluation

## 2018-01-04 NOTE — Lactation Note (Signed)
This note was copied from a baby's chart. Lactation Consultation Note  Patient Name: Laura Downs WUJWJ'XToday's Date: 01/04/2018 Reason for consult: Initial assessment;Term Baby is 7214 hours old and has had attempts at breast but no latch.  Mom states she is wearing shells to help evert nipple.  Baby recently had formula and sleeping in crib.  Instructed to watch for feeding cues and call out for lactation assist.  Mom agreeable.  Breastfeeding consultation services and support information reviewed and encouraged prn.  Maternal Data    Feeding Feeding Type: Bottle Fed - Formula Nipple Type: Slow - flow  LATCH Score                   Interventions    Lactation Tools Discussed/Used     Consult Status Consult Status: Follow-up Date: 01/05/18 Follow-up type: In-patient    Huston FoleyMOULDEN, Raguel Kosloski S 01/04/2018, 11:28 AM

## 2018-01-04 NOTE — Progress Notes (Signed)
Faculty Note  Patient is a 32 y.o. [id number] who is PPD#1 s/p VAVD.  In to see patient, she again affirms desire to have bilateral tubal ligation done. She understands that this is a permanent procedure and she will not be able to have children after it is done. Reviewed risks of bilateral tubal ligation including infection, hemorrhage, damage to surrounding tissue and organs, risk of regret. Reviewed that bilateral tubal ligation is not 100% effective and she should take a pregnancy test if she believes for any reason she may be pregnant. Reviewed slightly increased risk of ectopic pregnancy and need to seek care if she becomes pregnant. She understands this is an elective procedure and again affirms her desire. Consent signed, also consents to blood transfusion if necessary.   She is NPO To OR when ready   K. Therese SarahMeryl Davis, M.D. Attending Obstetrician & Gynecologist, [facility] for [employer] [employer], [facility]

## 2018-01-04 NOTE — Progress Notes (Signed)
Mother of baby was referred for history of depression. CSW spoke with MOB to discuss history of depression. MOB reported that she was formerly on Zoloft during pregnancy but ceased taking it because "things were getting better." [facility] so CSW encouraged her to reach out for assistance if symptoms return or she begins to feel like she needs to be back on her medication. CSW educated MOB on baby blues period versus postpartum depression, MOB stated understanding. CSW encouraged MOB to reach out to CSW department if questions or concerns arise in the future, MOB stated agreement.   Please contact CSW if mother of baby requests, if needs arise, or if mother of baby scores greater than a nine or answers yes to question ten on [place] Postpartum Depression Screen.   Edwin Dadaarol Laura Downs, MSW, LCSW-A Clinical Social Worker [facility] [phone]

## 2018-01-05 MED ORDER — OXYCODONE HCL 5 MG PO TABS: 5.0000 mg | ORAL_TABLET | ORAL | 0 refills | Status: DC | PRN

## 2018-01-05 MED ORDER — IBUPROFEN 600 MG PO TABS: 600.0000 mg | ORAL_TABLET | Freq: Four times a day (QID) | ORAL | 0 refills | Status: DC

## 2018-01-05 NOTE — Discharge Instructions (Signed)
Vaginal Delivery, Care After °Refer to this sheet in the next few weeks. These instructions provide you with information about caring for yourself after vaginal delivery. Your health care provider may also give you more specific instructions. Your treatment has been planned according to current medical practices, but problems sometimes occur. Call your health care provider if you have any problems or questions. °What can I expect after the procedure? °After vaginal delivery, it is common to have: °· Some bleeding from your vagina. °· Soreness in your abdomen, your vagina, and the area of skin between your vaginal opening and your anus (perineum). °· Pelvic cramps. °· Fatigue. ° °Follow these instructions at home: °Medicines °· Take over-the-counter and prescription medicines only as told by your health care provider. °· If you were prescribed an antibiotic medicine, take it as told by your health care provider. Do not stop taking the antibiotic until it is finished. °Driving ° °· Do not drive or operate heavy machinery while taking prescription pain medicine. °· Do not drive for 24 hours if you received a sedative. °Lifestyle °· Do not drink alcohol. This is especially important if you are breastfeeding or taking medicine to relieve pain. °· Do not use tobacco products, including cigarettes, chewing tobacco, or e-cigarettes. If you need help quitting, ask your health care provider. °Eating and drinking °· Drink at least 8 eight-ounce glasses of water every day unless you are told not to by your health care provider. If you choose to breastfeed your baby, you may need to drink more water than this. °· Eat high-fiber foods every day. These foods may help prevent or relieve constipation. High-fiber foods include: °? Whole grain cereals and breads. °? Brown rice. °? Beans. °? Fresh fruits and vegetables. °Activity °· Return to your normal activities as told by your health care provider. Ask your health care provider  what activities are safe for you. °· Rest as much as possible. Try to rest or take a nap when your baby is sleeping. °· Do not lift anything that is heavier than your baby or 10 lb (4.5 kg) until your health care provider says that it is safe. °· Talk with your health care provider about when you can engage in sexual activity. This may depend on your: °? Risk of infection. °? Rate of healing. °? Comfort and desire to engage in sexual activity. °Vaginal Care °· If you have an episiotomy or a vaginal tear, check the area every day for signs of infection. Check for: °? More redness, swelling, or pain. °? More fluid or blood. °? Warmth. °? Pus or a bad smell. °· Do not use tampons or douches until your health care provider says this is safe. °· Watch for any blood clots that may pass from your vagina. These may look like clumps of dark red, brown, or black discharge. °General instructions °· Keep your perineum clean and dry as told by your health care provider. °· Wear loose, comfortable clothing. °· Wipe from front to back when you use the toilet. °· Ask your health care provider if you can shower or take a bath. If you had an episiotomy or a perineal tear during labor and delivery, your health care provider may tell you not to take baths for a certain length of time. °· Wear a bra that supports your breasts and fits you well. °· If possible, have someone help you with household activities and help care for your baby for at least a few days after   you leave the hospital. °· Keep all follow-up visits for you and your baby as told by your health care provider. This is important. °Contact a health care provider if: °· You have: °? Vaginal discharge that has a bad smell. °? Difficulty urinating. °? Pain when urinating. °? A sudden increase or decrease in the frequency of your bowel movements. °? More redness, swelling, or pain around your episiotomy or vaginal tear. °? More fluid or blood coming from your episiotomy or  vaginal tear. °? Pus or a bad smell coming from your episiotomy or vaginal tear. °? A fever. °? A rash. °? Little or no interest in activities you used to enjoy. °? Questions about caring for yourself or your baby. °· Your episiotomy or vaginal tear feels warm to the touch. °· Your episiotomy or vaginal tear is separating or does not appear to be healing. °· Your breasts are painful, hard, or turn red. °· You feel unusually sad or worried. °· You feel nauseous or you vomit. °· You pass large blood clots from your vagina. If you pass a blood clot from your vagina, save it to show to your health care provider. Do not flush blood clots down the toilet without having your health care provider look at them. °· You urinate more than usual. °· You are dizzy or light-headed. °· You have not breastfed at all and you have not had a menstrual period for 12 weeks after delivery. °· You have stopped breastfeeding and you have not had a menstrual period for 12 weeks after you stopped breastfeeding. °Get help right away if: °· You have: °? Pain that does not go away or does not get better with medicine. °? Chest pain. °? Difficulty breathing. °? Blurred vision or spots in your vision. °? Thoughts about hurting yourself or your baby. °· You develop pain in your abdomen or in one of your legs. °· You develop a severe headache. °· You faint. °· You bleed from your vagina so much that you fill two sanitary pads in one hour. °This information is not intended to replace advice given to you by your health care provider. Make sure you discuss any questions you have with your health care provider. °Document Released: 06/14/2000 Document Revised: 11/29/2015 Document Reviewed: 07/02/2015 °Elsevier Interactive Patient Education © 2018 [employer] ° ° °Postpartum Tubal Ligation, Care After °Refer to this sheet in the next few weeks. These instructions provide you with information about caring for yourself after your procedure. Your health  care provider may also give you more specific instructions. Your treatment has been planned according to current medical practices, but problems sometimes occur. Call your health care provider if you have any problems or questions after your procedure. °What can I expect after the procedure? °After the procedure, it is common to have: °· A sore throat. °· Bruising or pain in your back. °· Nausea or vomiting. °· Dizziness. °· Mild abdominal discomfort or pain, such as cramping, gas pain, or feeling bloated. °· Soreness where the incision was made. °· Tiredness. °· Pain in your shoulders. ° °Follow these instructions at home: °Medicines °· Take over-the-counter and prescription medicines only as told by your health care provider. °· Do not take aspirin because it can cause bleeding. °· Do not drive or operate heavy machinery while taking prescription pain medicine. °Activity °· Rest for the rest of the day. °· Gradually return to your normal activities over the next few days. °· Do not have sex, douche,   or put a tampon or anything else in your vagina for 6 weeks or as long as told by your health care provider. °· Do not lift anything that is heavier than your baby for 2 weeks or as long as told by your health care provider. °Incision care °· Follow instructions from your health care provider about how to take care of your incision. Make sure you: °? Wash your hands with soap and water before you change your bandage (dressing). If soap and water are not available, use hand sanitizer. °? Change your dressing as told by your health care provider. °? Leave stitches (sutures) in place. They may need to stay in place for 2 weeks or longer. °· Check your incision area every day for signs of infection. Check for: °? More redness, swelling, or pain. °? More fluid or blood. °? Warmth. °? Pus or a bad smell. °Other Instructions °· Do not take baths, swim, or use a hot tub until your health care provider approves. You may take  showers. °· Keep all follow-up visits as told by your health care provider. This is important. °Contact a health care provider if: °· You have more redness, swelling, or pain around your incision. °· Your incision feels warm to the touch. °· You have pus or a bad smell coming from your incision. °· The edges of your incision break open after the sutures have been removed. °· Your pain does not improve after 2-3 days. °· You have a rash. °· You repeatedly become dizzy or lightheaded. °· Your pain medicine is not helping. °· You are constipated. °Get help right away if: °· You have a fever. °· You faint. °· You have pain in your abdomen that gets worse. °· You have fluid or blood coming from your sutures. °· You have shortness of breath or difficulty breathing. °· You have chest pain or leg pain. °· You have ongoing nausea or diarrhea. °This information is not intended to replace advice given to you by your health care provider. Make sure you discuss any questions you have with your health care provider. °Document Released: 12/17/2011 Document Revised: 11/20/2015 Document Reviewed: 05/28/2015 °Elsevier Interactive Patient Education © 2018 [employer] ° °

## 2018-01-05 NOTE — Discharge Summary (Signed)
OB Discharge Summary     Patient Name: Laura Downs DOB: 04/04/1986 MRN: [id number]  Date of admission: 01/03/2018 Delivering MD: Frederik PearEGELE, JULIE P   Date of discharge: 01/05/2018  Admitting diagnosis: 40WKS INDUCTION  Intrauterine pregnancy: 6050w0d     Secondary diagnosis:  Principal Problem:   Chronic hypertension affecting pregnancy Active Problems:   Supervision of high risk pregnancy, antepartum   Depression   Obesity complicating pregnancy in second trimester   Unwanted fertility   SVD (spontaneous vaginal delivery)  Additional problems: Undesired fertility     Discharge diagnosis: Term Pregnancy Delivered and CHTN                                                                                                Post partum procedures:postpartum tubal ligation  Augmentation: AROM, Pitocin and Foley Balloon  Complications: None  Hospital course:  Induction of Labor With Vaginal Delivery   32 y.o. yo [id number] at 4450w0d was admitted to the hospital 01/03/2018 for induction of labor.  Indication for induction: Chronic HTN.  Patient had an uncomplicated labor course as follows: Membrane Rupture Time/Date: 3:41 PM ,01/03/2018   Intrapartum Procedures: Episiotomy: None [1]                                         Lacerations:  None [1]  Patient had delivery of a Viable infant.  Information for the patient's newborn:  Laura Bowlerurvis, Girl Laura Downs [[id number]][[id number]]  Delivery Method: Vag-Spont   01/03/2018  Details of delivery can be found in separate delivery note.  Patient had a routine postpartum course. Patient is discharged home 01/05/18.  Physical exam  Vitals:   01/04/18 1646 01/04/18 2025 01/05/18 0100 01/05/18 0636  BP: 119/67 (!) 146/76 127/81 127/82  Pulse: 73 93 86 76  Resp: 18 18 16 16   Temp: 97.7 F (36.5 C) 98.5 F (36.9 C) 98.3 F (36.8 C) 98.5 F (36.9 C)  TempSrc: Oral Oral Oral Oral  SpO2: 100%     Weight:      Height:       General: alert, cooperative and no  distress Lochia: appropriate Uterine Fundus: firm Incision: Healing well with no significant drainage, No significant erythema DVT Evaluation: No evidence of DVT seen on physical exam. Labs: Lab Results  Component Value Date   WBC 14.7 (H) 01/04/2018   HGB 9.9 (L) 01/04/2018   HCT 30.4 (L) 01/04/2018   MCV 88.1 01/04/2018   PLT 189 01/04/2018   CMP Latest Ref Rng & Units 07/28/2017  Glucose 65 - 99 mg/dL 84  BUN 6 - 20 mg/dL 5(L)  Creatinine 9.560.57 - 1.00 mg/dL 3.870.59  Sodium 564134 - 332144 mmol/L 141  Potassium 3.5 - 5.2 mmol/L 3.4(L)  Chloride 96 - 106 mmol/L 102  CO2 20 - 29 mmol/L 23  Calcium 8.7 - 10.2 mg/dL 9.1  Total Protein 6.0 - 8.5 g/dL 6.8  Total Bilirubin 0.0 - 1.2 mg/dL <9.5<0.2  Alkaline Phos 39 - 117 IU/L 53  AST  0 - 40 IU/L 18  ALT 0 - 32 IU/L 26    Discharge instruction: per After Visit Summary and "Baby and Me Booklet".  After visit meds:  Allergies as of 01/05/2018   No Known Allergies     Medication List    TAKE these medications   butalbital-acetaminophen-caffeine 50-325-40 MG tablet Commonly known as:  FIORICET, ESGIC Take 1-2 tablets by mouth every 6 (six) hours as needed for headache.   ibuprofen 600 MG tablet Commonly known as:  ADVIL,MOTRIN Take 1 tablet (600 mg total) by mouth every 6 (six) hours.   oxyCODONE 5 MG immediate release tablet Commonly known as:  Oxy IR/ROXICODONE Take 1 tablet (5 mg total) by mouth every 4 (four) hours as needed for severe pain or breakthrough pain.       Diet: routine diet  Activity: Advance as tolerated. Pelvic rest for 6 weeks.   Outpatient follow up:1 week BP check Follow up Appt:No future appointments. Follow up Visit:No follow-ups on file.  Postpartum contraception: Tubal Ligation  Newborn Data: Live born female  Birth Weight: 6 lb 14.6 oz (3135 g) APGAR: 8, 9  Newborn Delivery   Birth date/time:  01/03/2018 21:25:00 Delivery type:  Vaginal, Vacuum (Extractor)     Baby Feeding:  Bottle Disposition:home with mother   01/05/2018 Laura Downs, CNM

## 2018-01-05 NOTE — Lactation Note (Signed)
This note was copied from a baby's chart. Lactation Consultation Note  Patient Name: Laura Percell Beltlexis Claire ONGEX'BToday's Date: 01/05/2018 Reason for consult: Follow-up assessment;Difficult latch;Term  P4 mother whose infant is now 4232 hours old.  Mother's feeding choice on admission was breast/bottle but has not breastfed since 1455 on 01/04/18  I noticed that mother had not been breastfeeding so went in to discuss her feeding choice.  She stated that she wanted to breast and bottle feed.  I explained to always breastfeed first before supplementing.  Mother's breasts are soft and non tender with short shafted nipples bilaterally.  Mother has been wearing breast shells and states they have helped evert her nipples a little bit.  Mother had just fed 10 mls of Gerber Good Start prior to my arrival.  However, baby is awake, alert and showing feeding cues.  I offered to assist with latch and mother accepted.  Attempted to latch onto right breast without difficulty.  Baby had wide mouth with flanged lips.  She would suck but lose interest easily when nipple would not stay everted.  After a couple of attempts I tried to latch on the left breast in the football hold.  Again, baby latched well but could not maintain her suck.  I suggested mother begin pumping with the DEBP to help increase milk supply and evert nipples.  Mother willing to try.  #24 flange size appropriate at this time.  Pump parts, assembly, disassembly and cleaning explained.  Mother provided with a curved tip syringe to feed back any EBM she obtains from pumping.  She also knows how to finger feed if only drops are obtained.  Mother will call for assistance as needed today and continue to wear her breast shells.  I also encouraged STS, breast massage and hand expression after feedings.  Her young daughter is present.  RN updated.   Maternal Data Formula Feeding for Exclusion: No Has patient been taught Hand Expression?: Yes Does the patient have  breastfeeding experience prior to this delivery?: Yes  Feeding Feeding Type: Breast Fed Length of feed: 5 min  LATCH Score Latch: Grasps breast easily, tongue down, lips flanged, rhythmical sucking.  Audible Swallowing: None  Type of Nipple: Everted at rest and after stimulation(short shafted bilaterally)  Comfort (Breast/Nipple): Soft / non-tender  Hold (Positioning): Assistance needed to correctly position infant at breast and maintain latch.  LATCH Score: 7  Interventions Interventions: Breast feeding basics reviewed;Assisted with latch;Skin to skin;Breast massage;Hand express;Position options;Support pillows;Adjust position;Breast compression;Shells;Hand pump;DEBP  Lactation Tools Discussed/Used Tools: Shells;Pump Shell Type: Inverted Breast pump type: Double-Electric Breast Pump Pump Review: Setup, frequency, and cleaning;Milk Storage Initiated by:: Laura Downs Date initiated:: 01/05/18   Consult Status Consult Status: Follow-up Date: 01/06/18 Follow-up type: In-patient    Laura Downs 01/05/2018, 5:45 AM

## 2018-01-05 NOTE — Progress Notes (Signed)
Bedside report was given to on-coming RN. Patient voiced that she did not want to breastfeed. Patient wants to bottle feed her baby formula even though she had met with the lactation consultant this AM.

## 2018-01-06 ENCOUNTER — Telehealth: Payer: Self-pay | Admitting: Family Medicine

## 2018-01-06 NOTE — Telephone Encounter (Signed)
Patient called to make an appointment for one week BP check. When I asked her if she [facility] to [facility], she stated what's that. I explain a provider here in the office she could talk to. She decline the visit to speak with Asher MuirJamie. She stated she is fine, and don't need this visit.

## 2018-01-07 NOTE — Anesthesia Postprocedure Evaluation (Signed)
Anesthesia Post Note  Patient: Jan Firemanlexis D Garbers  Procedure(s) Performed: POST PARTUM TUBAL LIGATION (N/A )     Patient location during evaluation: PACU Anesthesia Type: Epidural Level of consciousness: oriented and awake and alert Pain management: pain level controlled Vital Signs Assessment: post-procedure vital signs reviewed and stable Respiratory status: spontaneous breathing, respiratory function stable and patient connected to nasal cannula oxygen Cardiovascular status: blood pressure returned to baseline and stable Postop Assessment: no headache, no backache, no apparent nausea or vomiting and epidural receding Anesthetic complications: no    Last Vitals:  Vitals:   01/05/18 0100 01/05/18 0636  BP: 127/81 127/82  Pulse: 86 76  Resp: 16 16  Temp: 36.8 C 36.9 C  SpO2:      Last Pain:  Vitals:   01/05/18 0911  TempSrc:   PainSc: 0-No pain                 Kamali Sakata S

## 2018-01-09 ENCOUNTER — Ambulatory Visit (INDEPENDENT_AMBULATORY_CARE_PROVIDER_SITE_OTHER): Payer: Medicaid Other | Admitting: *Deleted

## 2018-01-09 VITALS — BP 146/84 | HR 70 | Ht 65.0 in | Wt 242.6 lb

## 2018-01-09 DIAGNOSIS — Z0130 Encounter for examination of blood pressure without abnormal findings: Secondary | ICD-10-CM

## 2018-01-09 MED ORDER — ENALAPRIL MALEATE 2.5 MG PO TABS: 2.5000 mg | ORAL_TABLET | Freq: Every day | ORAL | 1 refills | Status: DC

## 2018-01-09 NOTE — Progress Notes (Signed)
Patient seen and assessed by nursing staff.  Agree with documentation and plan.  

## 2018-01-09 NOTE — Progress Notes (Signed)
Pt here for PP BP check. She reports having occasional headaches - relieved by Fioricet. She denies all visual disturbances. She is bottle feeding. Results shared with Dr. Shawnie PonsPratt - Rx for Vasotec sent to pharmacy.  Pt to return in 2 weeks for BP check or can have BP check by Home health nurse in 2 weeks if preferred.  Pt advised to return to hospital immediately if she develops severe H/A or visual disturbances as previously explained.  Pt voiced understanding.

## 2018-01-12 ENCOUNTER — Encounter (HOSPITAL_COMMUNITY): Payer: Self-pay

## 2018-01-23 ENCOUNTER — Ambulatory Visit (INDEPENDENT_AMBULATORY_CARE_PROVIDER_SITE_OTHER): Payer: Medicaid Other

## 2018-01-23 VITALS — BP 143/84

## 2018-01-23 DIAGNOSIS — Z0130 Encounter for examination of blood pressure without abnormal findings: Secondary | ICD-10-CM

## 2018-01-23 NOTE — Progress Notes (Signed)
Pt here today for BP check s/p starting Vasotec 2.5 mg po daily.  BP elevated.  Clarified with how she has been taking the medication.  Pt states that she has been taking the medication only when she is having sx.  I explained to the pt that the medication is to be taken daily.  Pt stated that she did know but will start taking the medication as prescribed.  Notified Dr. Debroah LoopArnold, provider recommendation to take medication as prescribed and we will f/u with her at her pp visit on 02/09/18.  Notified pt of providers recommendation and to continue to monitor for sx.  Pt stated understanding with no further questions.

## 2018-02-09 ENCOUNTER — Encounter: Payer: Self-pay | Admitting: Family Medicine

## 2018-02-09 ENCOUNTER — Ambulatory Visit (INDEPENDENT_AMBULATORY_CARE_PROVIDER_SITE_OTHER): Payer: Medicaid Other | Admitting: Family Medicine

## 2018-02-09 DIAGNOSIS — Z1389 Encounter for screening for other disorder: Secondary | ICD-10-CM

## 2018-02-09 DIAGNOSIS — O10919 Unspecified pre-existing hypertension complicating pregnancy, unspecified trimester: Secondary | ICD-10-CM

## 2018-02-09 MED ORDER — ENALAPRIL MALEATE 10 MG PO TABS: 10.0000 mg | ORAL_TABLET | Freq: Every day | ORAL | 0 refills | Status: DC

## 2018-02-09 NOTE — Progress Notes (Signed)
Subjective:     Laura Downs is a 32 y.o. female who presents for a postpartum visit. She is 5 weeks postpartum following a spontaneous vaginal delivery. I have fully reviewed the prenatal and intrapartum course. The delivery was at 40 gestational weeks. Outcome: spontaneous vaginal delivery. Anesthesia: epidural. Postpartum course has been normal. Baby's course has been normal. Baby is feeding by bottle Rush Barer- Gerber. Bleeding staining only. Bowel function is normal. Bladder function is normal. Patient is not sexually active. Contraception method is tubal ligation. Postpartum depression screening: negative.  The following portions of the patient's history were reviewed and updated as appropriate: allergies, current medications, past family history, past medical history, past social history, past surgical history and problem list.  Review of Systems Pertinent items are noted in HPI.   Objective:    BP (!) 157/94 (BP Location: Left Arm)   Pulse 69   Ht 5\' 4"  (1.626 m)   Wt 233 lb 6.4 oz (105.9 kg)   LMP 02/09/2018   Breastfeeding? No   BMI 40.06 kg/m   General:  alert, cooperative and no distress  Lungs: clear to auscultation bilaterally  Heart:  regular rate and rhythm, S1, S2 normal, no murmur, click, rub or gallop  Abdomen: soft, non-tender; bowel sounds normal; no masses,  no organomegaly        Assessment:     Normal postpartum exam. CHTN  Plan:    1. Contraception: tubal ligation 2. Increase vasotec to 10mg  daily 3. Follow up in: 4 months or as needed.

## 2018-03-06 ENCOUNTER — Other Ambulatory Visit: Payer: Self-pay | Admitting: Family Medicine

## 2018-05-08 ENCOUNTER — Other Ambulatory Visit: Payer: Self-pay | Admitting: Family Medicine

## 2018-10-04 ENCOUNTER — Encounter (HOSPITAL_COMMUNITY): Payer: Self-pay | Admitting: Emergency Medicine

## 2018-10-04 ENCOUNTER — Other Ambulatory Visit: Payer: Self-pay

## 2018-10-04 ENCOUNTER — Ambulatory Visit (HOSPITAL_COMMUNITY)
Admission: EM | Admit: 2018-10-04 | Discharge: 2018-10-04 | Disposition: A | Discharge status: Home / Self Care | Payer: Medicaid Other | Attending: Family Medicine | Admitting: Family Medicine

## 2018-10-04 DIAGNOSIS — J039 Acute tonsillitis, unspecified: Secondary | ICD-10-CM | POA: Diagnosis not present

## 2018-10-04 MED ORDER — AMOXICILLIN-POT CLAVULANATE 875-125 MG PO TABS: 1.0000 | ORAL_TABLET | Freq: Two times a day (BID) | ORAL | 0 refills | Status: AC

## 2018-10-04 MED ORDER — CEFTRIAXONE SODIUM 1 G IJ SOLR
INTRAMUSCULAR | Status: AC
  Filled 2018-10-04: qty 10

## 2018-10-04 MED ORDER — CEFTRIAXONE SODIUM 1 G IJ SOLR
1.0000 g | Freq: Once | INTRAMUSCULAR | Status: AC
  Administered 2018-10-04: 1 g via INTRAMUSCULAR

## 2018-10-04 MED ORDER — PREDNISONE 20 MG PO TABS: 20.0000 mg | ORAL_TABLET | Freq: Two times a day (BID) | ORAL | 0 refills | Status: AC

## 2018-10-04 MED ORDER — LIDOCAINE HCL (PF) 1 % IJ SOLN
INTRAMUSCULAR | Status: AC
  Filled 2018-10-04: qty 2

## 2018-10-04 MED ORDER — AMOXICILLIN-POT CLAVULANATE 875-125 MG PO TABS: 1.0000 | ORAL_TABLET | Freq: Two times a day (BID) | ORAL | 0 refills | Status: DC

## 2018-10-04 NOTE — ED Triage Notes (Signed)
Pt here for left sided ear pain into mouth area

## 2018-10-04 NOTE — Discharge Instructions (Addendum)
Please begin Augmentin twice daily for the next 10 days   Use anti-inflammatories for pain/swelling. You may take up to 800 mg Ibuprofen every 8 hours with food. You may supplement Ibuprofen with Tylenol [phone] mg every 8 hours.   If you do not have any improvement in your symptoms in the next 24 to 48 hours with starting the antibiotics please go to the emergency room.  If you develop worsening pain, swelling, difficulty breathing, neck stiffness, fevers please also go to the emergency room.

## 2018-10-04 NOTE — ED Provider Notes (Addendum)
[facility]    CSN: [id number] Arrival date & time: 10/04/18  1049     History   Chief Complaint Chief Complaint  Patient presents with  . Otalgia    HPI Laura Downs is a 33 y.o. female history of hypertension, presenting today for evaluation of left ear and throat pain.  Patient states that for the past 2 weeks she has had pain in her right ear that radiates to her throat.  She has had sore throat and pain with eating as well with this.  She denies any fevers.  Denies other associated URI symptoms including denying congestion, rhinorrhea or cough.  She has taken some Tylenol which has relieved her symptoms temporarily, but her pain has been persistent despite this.  She does feel like she has difficulty opening her mouth fully.  Has pain when moving her neck to the right.  HPI  Past Medical History:  Diagnosis Date  . Depression    current pregnancy  . Hypertension   . Vaginal Pap smear, abnormal     Patient Active Problem List   Diagnosis Date Noted  . Chronic hypertension during pregnancy, antepartum 07/28/2017  . Migraines 07/28/2017  . Depression     Past Surgical History:  Procedure Laterality Date  . COLPOSCOPY    . LEEP  2008   paps have been normal since.  . TUBAL LIGATION N/A 01/04/2018   Procedure: POST PARTUM TUBAL LIGATION;  Surgeon: Conan Bowens, MD;  Location: [facility];  Service: Gynecology;  Laterality: N/A;    OB History    Gravida  4   Para  4   Term  4   Preterm  0   AB  0   Living  4     SAB  0   TAB  0   Ectopic  0   Multiple  0   Live Births  4            Home Medications    Prior to Admission medications   Medication Sig Start Date End Date Taking? Authorizing Provider  amoxicillin-clavulanate (AUGMENTIN) 875-125 MG tablet Take 1 tablet by mouth every 12 (twelve) hours for 10 days. 10/04/18 10/14/18  Infant Doane, Junius Creamer, PA-C  butalbital-acetaminophen-caffeine (FIORICET, ESGIC) [phone] MG  tablet Take 1-2 tablets by mouth every 6 (six) hours as needed for headache. 11/19/17 11/19/18  Caryl Ada Y, DO  enalapril (VASOTEC) 10 MG tablet TAKE 1 TABLET(10 MG) BY MOUTH DAILY 05/08/18   Levie Heritage, DO  enalapril (VASOTEC) 2.5 MG tablet TAKE 1 TABLET(2.5 MG) BY MOUTH DAILY 03/07/18   Reva Bores, MD  ibuprofen (ADVIL,MOTRIN) 600 MG tablet Take 1 tablet (600 mg total) by mouth every 6 (six) hours. 01/05/18   Aviva Signs, CNM  predniSONE (DELTASONE) 20 MG tablet Take 1 tablet (20 mg total) by mouth 2 (two) times daily with a meal for 3 days. 10/04/18 10/07/18  Arturo Sofranko, Junius Creamer, PA-C    Family History Family History  Problem Relation Age of Onset  . Diabetes Maternal Aunt   . Hypertension Maternal Grandmother     Social History Social History   Tobacco Use  . Smoking status: Never Smoker  . Smokeless tobacco: Never Used  Substance Use Topics  . Alcohol use: No  . Drug use: No     Allergies   Patient has no known allergies.   Review of Systems Review of Systems  Constitutional: Negative for activity change, appetite change,  chills, fatigue and fever.  HENT: Positive for ear pain and sore throat. Negative for congestion, hearing loss, rhinorrhea, sinus pressure and trouble swallowing.   Eyes: Negative for discharge and redness.  Respiratory: Negative for cough, chest tightness and shortness of breath.   Cardiovascular: Negative for chest pain.  Gastrointestinal: Negative for abdominal pain, diarrhea, nausea and vomiting.  Musculoskeletal: Negative for myalgias.  Skin: Negative for rash.  Neurological: Negative for dizziness, light-headedness and headaches.     Physical Exam Triage Vital Signs ED Triage Vitals  Enc Vitals Group     BP 10/04/18 1117 133/84     Pulse Rate 10/04/18 1117 91     Resp 10/04/18 1117 18     Temp 10/04/18 1117 98.4 F (36.9 C)     Temp Source 10/04/18 1117 Oral     SpO2 10/04/18 1117 96 %     Weight --      Height --      Head  Circumference --      Peak Flow --      Pain Score 10/04/18 1118 8     Pain Loc --      Pain Edu? --      Excl. in GC? --    No data found.  Updated Vital Signs BP 133/84 (BP Location: Right Arm)   Pulse 91   Temp 98.4 F (36.9 C) (Oral)   Resp 18   SpO2 96%   Visual Acuity Right Eye Distance:   Left Eye Distance:   Bilateral Distance:    Right Eye Near:   Left Eye Near:    Bilateral Near:     Physical Exam Vitals signs and nursing note reviewed.  Constitutional:      General: She is not in acute distress.    Appearance: She is well-developed.  HENT:     Head: Normocephalic and atraumatic.     Ears:     Comments: Bilateral ears without tenderness to palpation of external auricle, tragus and mastoid, EAC's without erythema or swelling, TM's with good bony landmarks and cone of light. Non erythematous.     Nose:     Comments: nasal mucosa slightly erythematous, mildly swollen turbinates, no rhinorrhea present    Mouth/Throat:     Comments: Patient appears to only be opening jaw/mouth partially, difficult to see posterior pharynx/tonsils due to limited opening of mouth and tongue, with repositioning, gag reflex was triggered with tongue depressor and tonsils appeared bilaterally enlarged with mild erythema, no obvious soft palate swelling, but visualization was brief Eyes:     Conjunctiva/sclera: Conjunctivae normal.  Neck:     Musculoskeletal: Neck supple.  Cardiovascular:     Rate and Rhythm: Normal rate and regular rhythm.     Heart sounds: No murmur.  Pulmonary:     Effort: Pulmonary effort is normal. No respiratory distress.     Breath sounds: Normal breath sounds.     Comments: Breathing comfortably at rest, CTABL, no wheezing, rales or other adventitious sounds auscultated Abdominal:     Palpations: Abdomen is soft.     Tenderness: There is no abdominal tenderness.  Skin:    General: Skin is warm and dry.  Neurological:     Mental Status: She is alert.       UC Treatments / Results  Labs (all labs ordered are listed, but only abnormal results are displayed) Labs Reviewed - No data to display  EKG None  Radiology No results found.  Procedures Procedures (including  critical care time)  Medications Ordered in UC Medications  cefTRIAXone (ROCEPHIN) injection 1 g (1 g Intramuscular Given 10/04/18 1201)    Initial Impression / Assessment and Plan / UC Course  I have reviewed the triage vital signs and the nursing notes.  Pertinent labs & imaging results that were available during my care of the patient were reviewed by me and considered in my medical decision making (see chart for details).     Patient appears to have a tonsillitis, no clear sign of abscess at this time although visualization was very brief.  Patient maintaining airway, voice is not muffled, no fever or tachycardia.  At this time will provide trial of outpatient antibiotic therapy with 1 g Rocephin prior to discharge followed by Augmentin x10 days.  Will have patient follow-up in emergency room for further evaluation/treatment if symptoms persisting without improvement with antibiotics, or sooner if developing worsening symptoms.  Continue anti-inflammatories for pain.Discussed strict return precautions. Patient verbalized understanding and is agreeable with plan.  Also prescribed patient 20 mg of prednisone twice daily x3 days to further help with swelling and inflammation in tonsils.  This was prescribed after discharge, but patient was contacted and discussed instructions and recommendations for medicine.  Patient verbalized understanding.  Final Clinical Impressions(s) / UC Diagnoses   Final diagnoses:  Acute tonsillitis, unspecified etiology     Discharge Instructions     Please begin Augmentin twice daily for the next 10 days   Use anti-inflammatories for pain/swelling. You may take up to 800 mg Ibuprofen every 8 hours with food. You may supplement  Ibuprofen with Tylenol [phone] mg every 8 hours.   If you do not have any improvement in your symptoms in the next 24 to 48 hours with starting the antibiotics please go to the emergency room.  If you develop worsening pain, swelling, difficulty breathing, neck stiffness, fevers please also go to the emergency room.    ED Prescriptions    Medication Sig Dispense Auth. Provider   amoxicillin-clavulanate (AUGMENTIN) 875-125 MG tablet  (Status: Discontinued) Take 1 tablet by mouth every 12 (twelve) hours. 14 tablet Madelena Maturin C, PA-C   amoxicillin-clavulanate (AUGMENTIN) 875-125 MG tablet Take 1 tablet by mouth every 12 (twelve) hours for 10 days. 20 tablet Kale Dols C, PA-C   predniSONE (DELTASONE) 20 MG tablet Take 1 tablet (20 mg total) by mouth 2 (two) times daily with a meal for 3 days. 6 tablet Adonia Porada, [place] C, PA-C     Controlled Substance Prescriptions [place] Controlled Substance Registry consulted? Not Applicable   Lew Dawes, PA-C 10/04/18 1207    Lew Dawes, [place] 10/04/18 1217

## 2019-02-09 IMAGING — US US FETAL BPP W/ NON-STRESS
1 series · 13 of 14 positions shown · non-contrast
Comparison: none

[Series 1: us fetal bpp w/nonstress · 14 acquisitions, 13 frames shown]
[im 1/14]
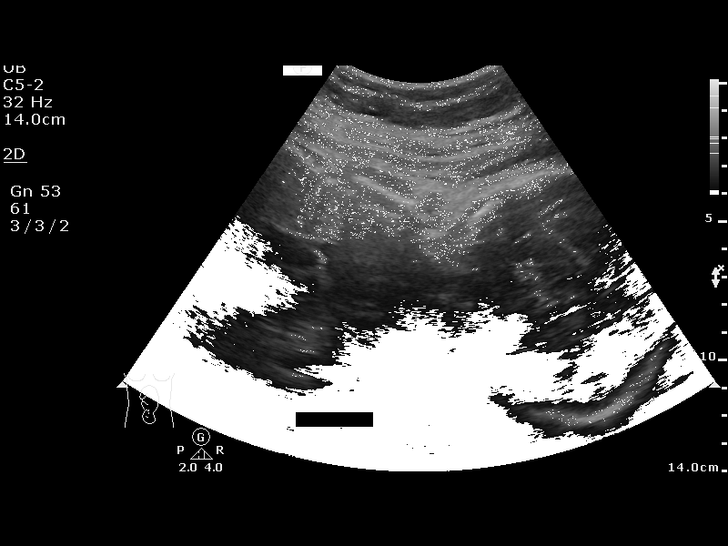
[im 2/14]
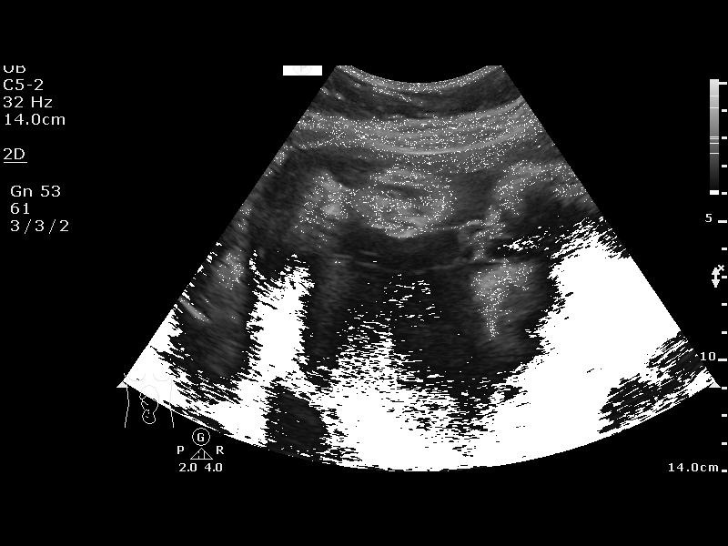
[im 3/14]
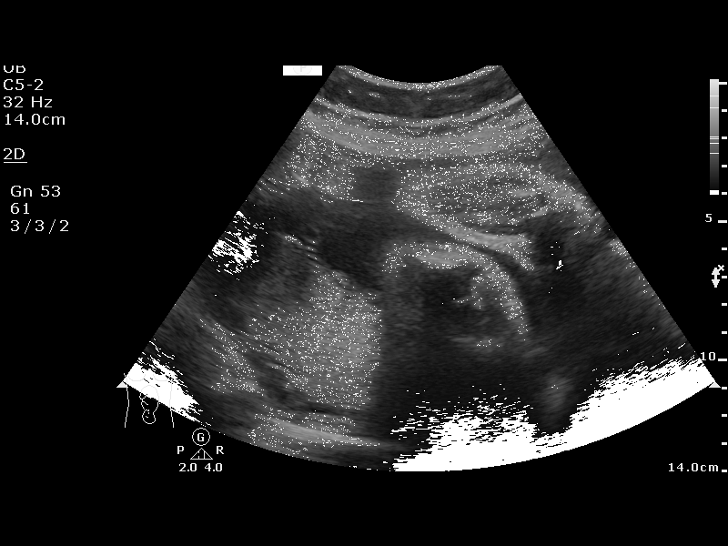
[im 4/14]
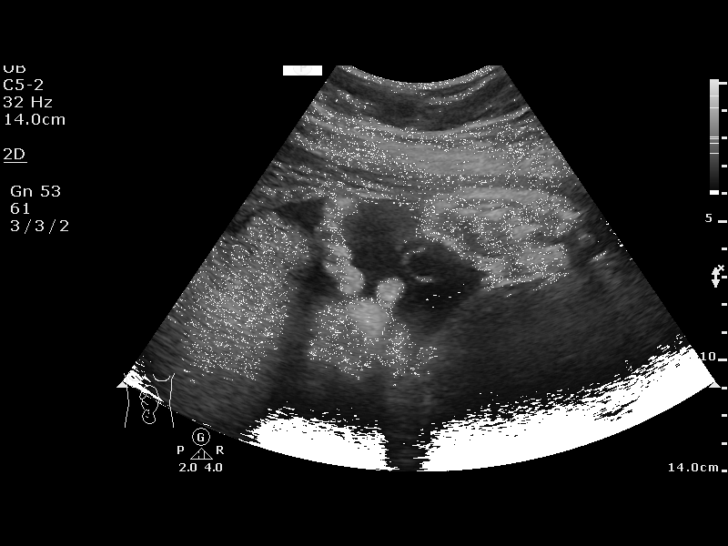
[im 5/14]
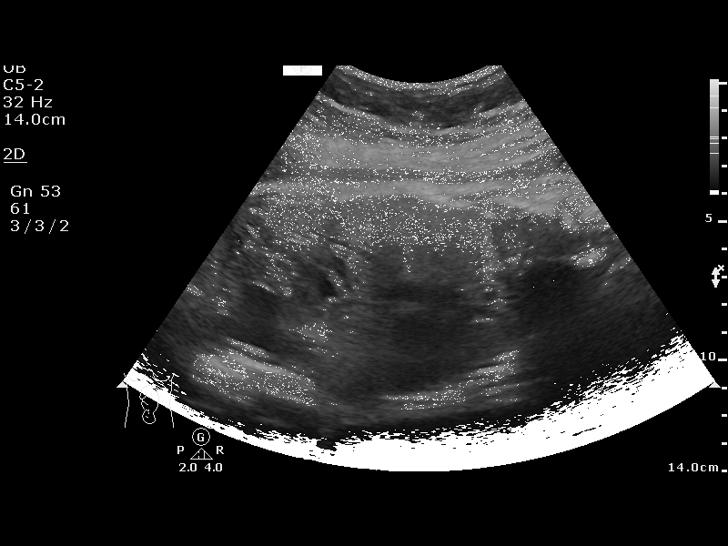
[im 6/14]
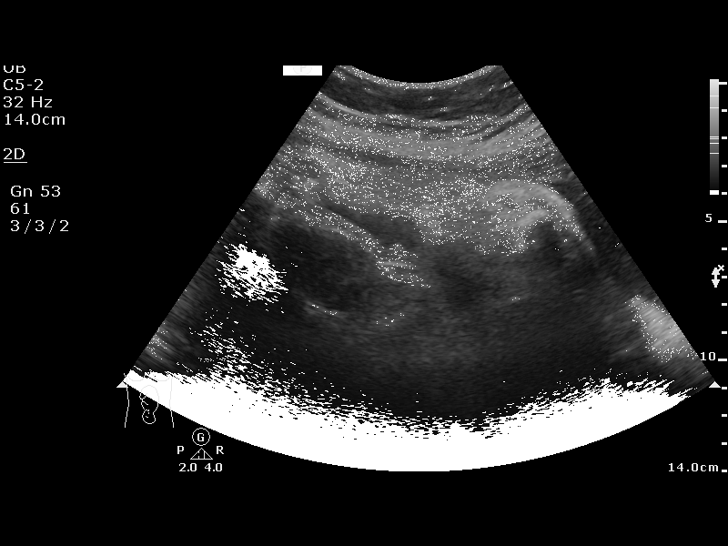
[im 8/14]
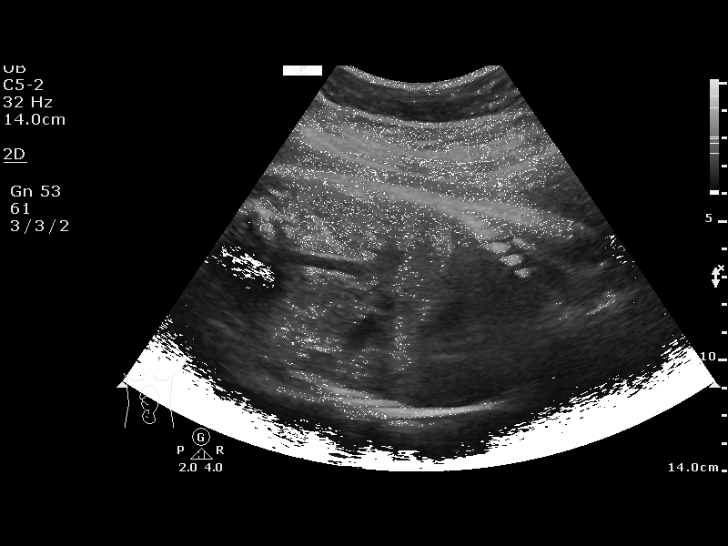
[im 9/14]
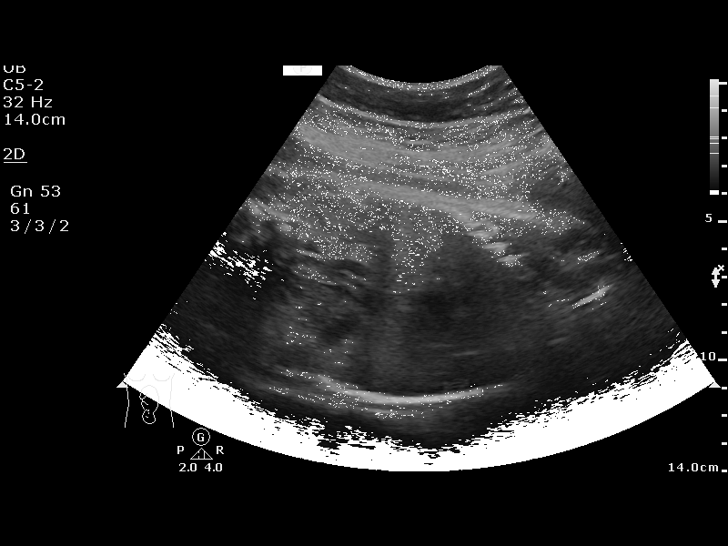
[im 10/14]
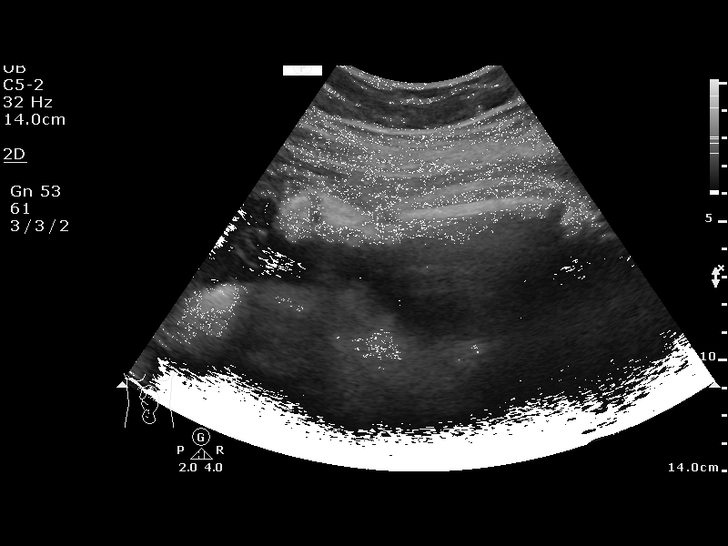
[im 11/14]
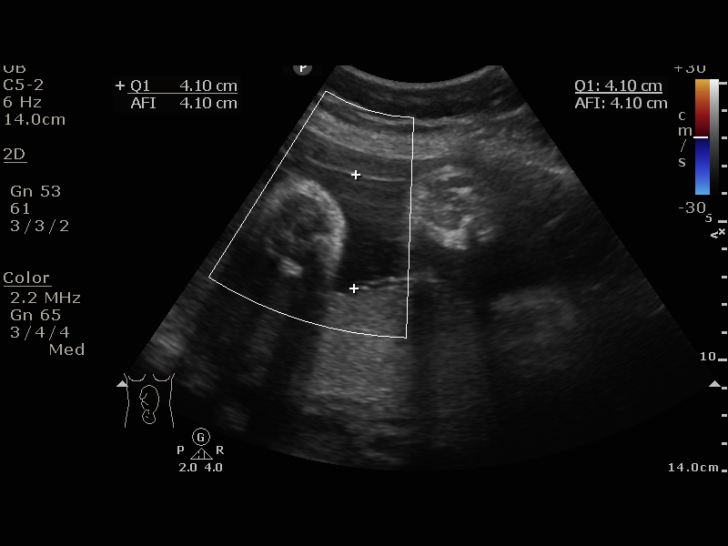
[im 12/14]
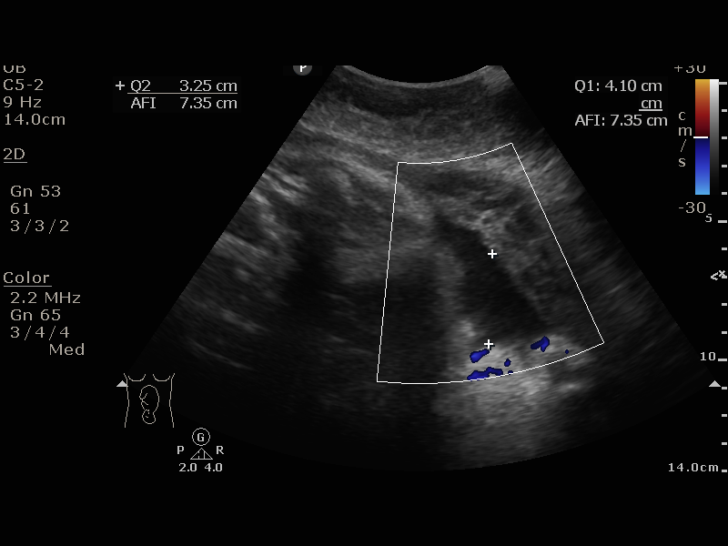
[im 13/14]
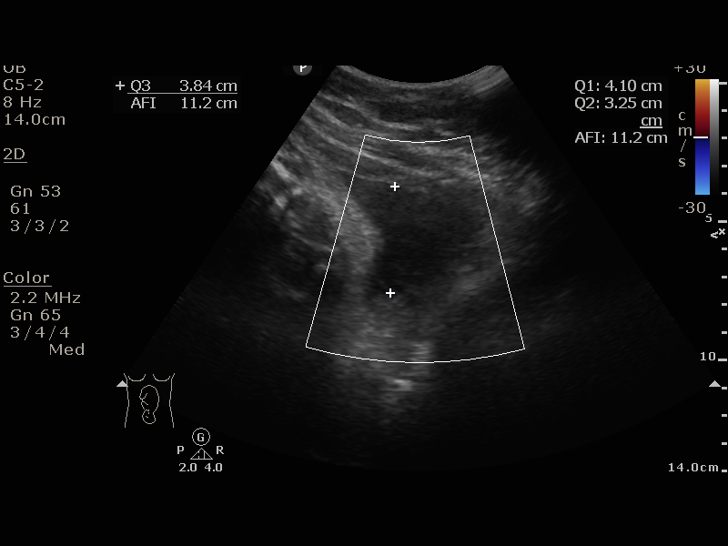
[im 14/14]
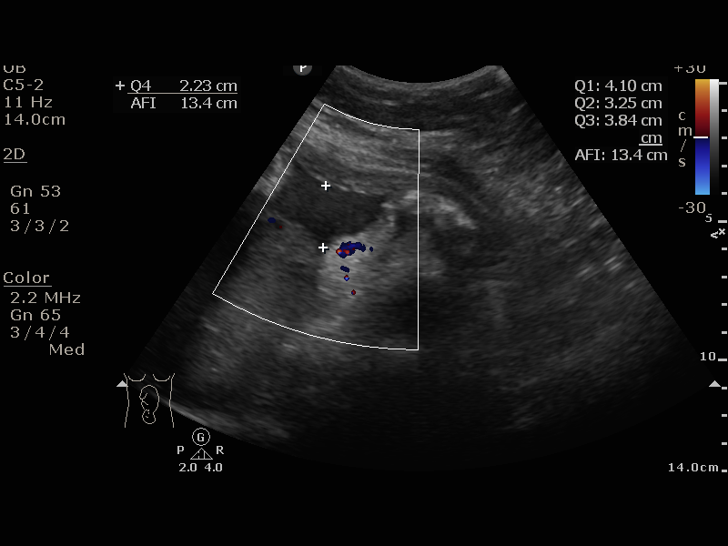

[13 of 14 positions shown; findings below may reference images not displayed]

[facility] Clinic
[facility]
[REDACTED]

1  US FETAL BPP W/NONSTRESS                    76818.4

1  POLIN BILLIOT            [id number]      [id number]     [id number]
Service(s) Provided

Indications

35 weeks gestation of pregnancy
Unspecified pre-existing hypertension
complicating pregnancy, third trimester
OB History

Blood Type:            Height:  5'5"   Weight (lb):  224       BMI:
Gravidity:    4         Term:   3        Prem:   0        SAB:   0
TOP:          0       Ectopic:  0        Living: 3
Fetal Evaluation

Num Of Fetuses:     1
Preg. Location:     Intrauterine
Cardiac Activity:   Observed
Presentation:       Cephalic

Amniotic Fluid
AFI FV:      Subjectively within normal limits

AFI Sum(cm)     %Tile       Largest Pocket(cm)
13.42           47
RUQ(cm)       RLQ(cm)       LUQ(cm)        LLQ(cm)
4.1
Biophysical Evaluation

Amniotic F.V:   Pocket => 2 cm two         F. Tone:        Observed
planes
F. Movement:    Observed                   N.S.T:          Reactive
F. Breathing:   Observed                   Score:          [DATE]
Gestational Age

LMP:           37w 0d        Date:  03/21/17                 EDD:   12/26/17
Best:          35w 6d     Det. By:  U/S C R L  (06/26/17)    EDD:   01/03/18
Impression

IUP at  98w9d
Normal amniotic fluid volume
Recommendations

Continue recommended antenatal testing and delivery plan

## 2019-02-16 IMAGING — US US FETAL BPP W/ NON-STRESS
1 series · 13 of 14 positions shown · non-contrast
Comparison: none

[Series 1: us fetal bpp w/nonstress · 14 acquisitions, 13 frames shown]
[im 1/14]
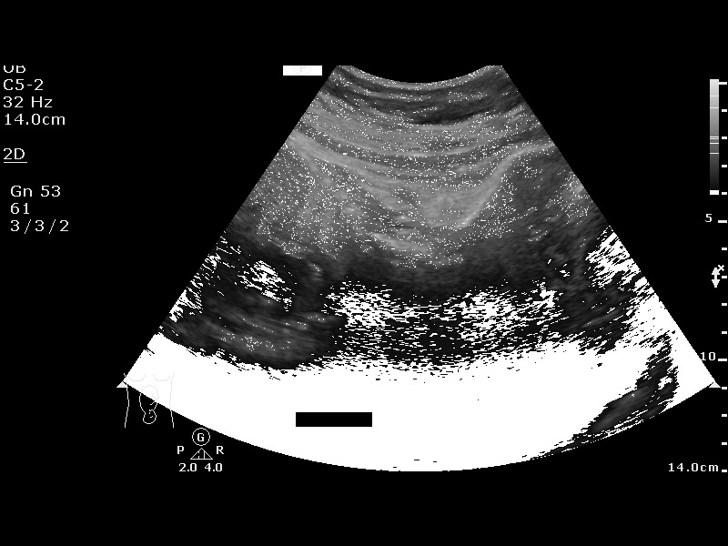
[im 2/14]
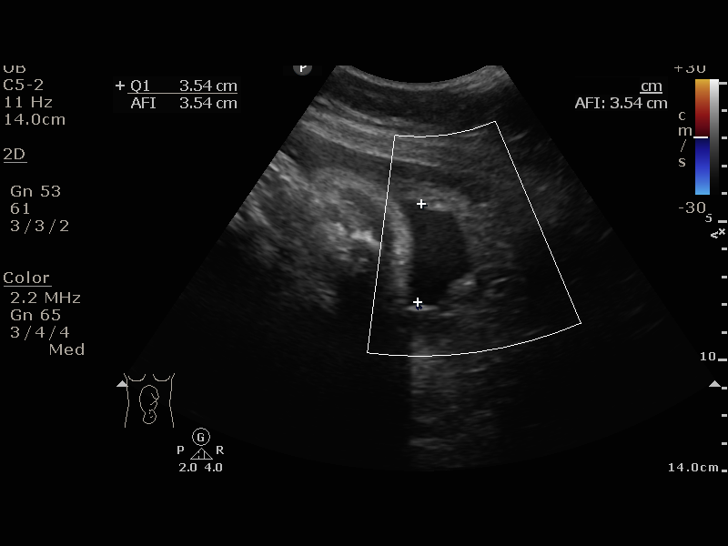
[im 3/14]
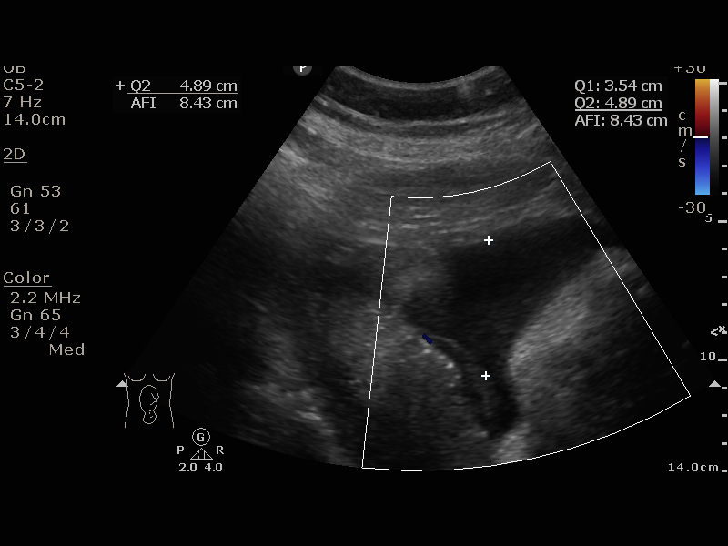
[im 4/14]
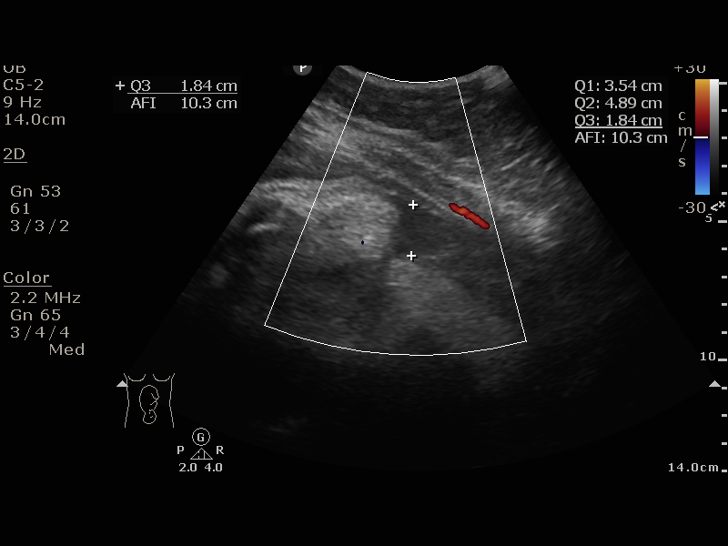
[im 5/14]
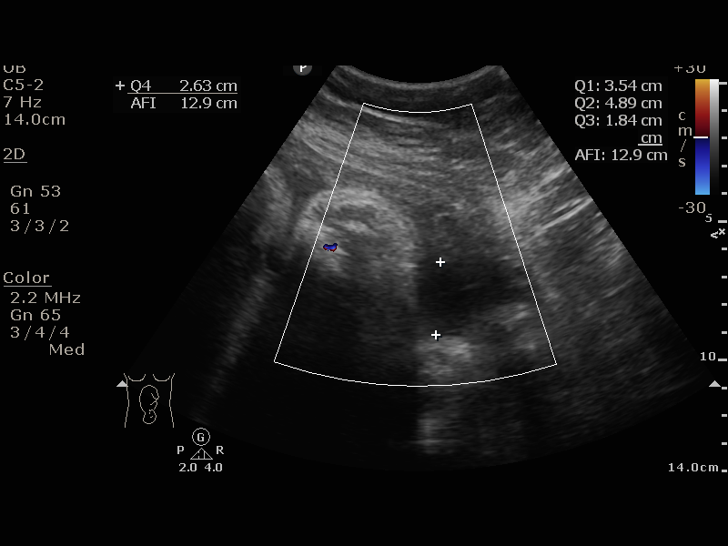
[im 6/14]
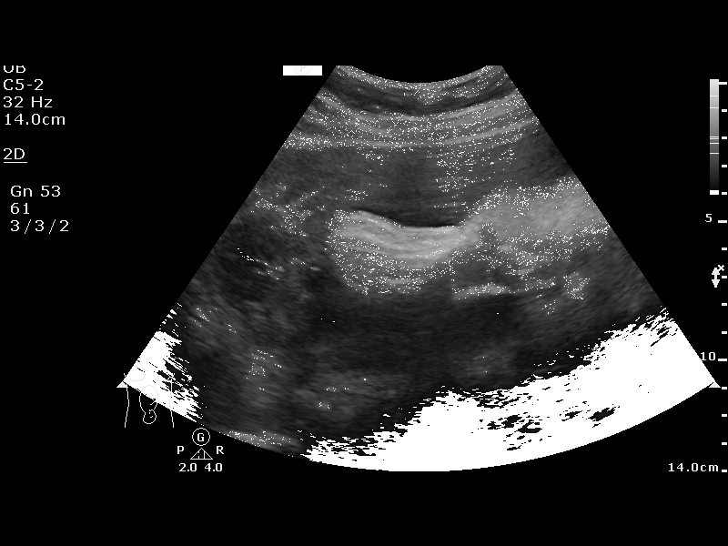
[im 8/14]
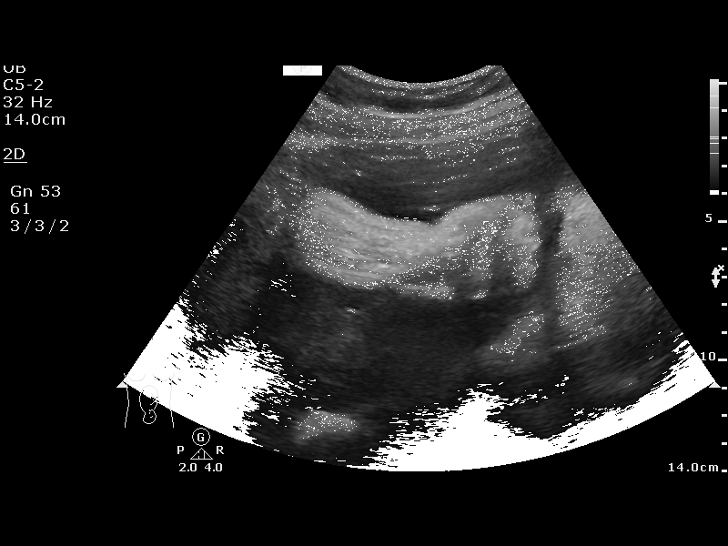
[im 9/14]
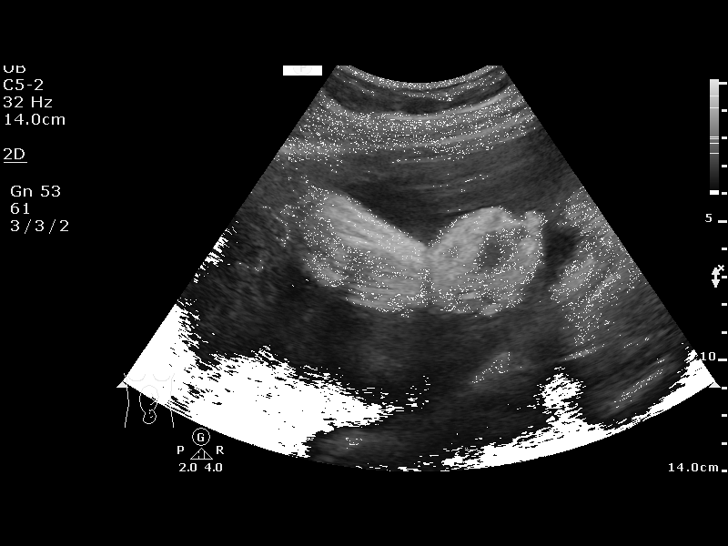
[im 10/14]
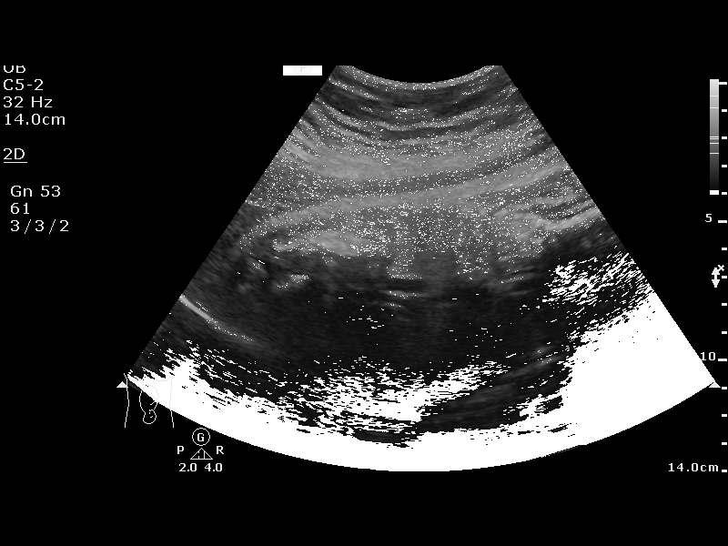
[im 11/14]
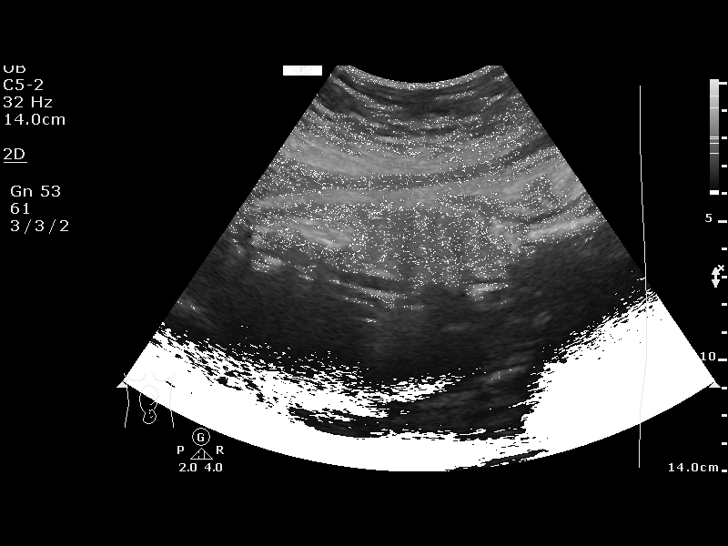
[im 12/14]
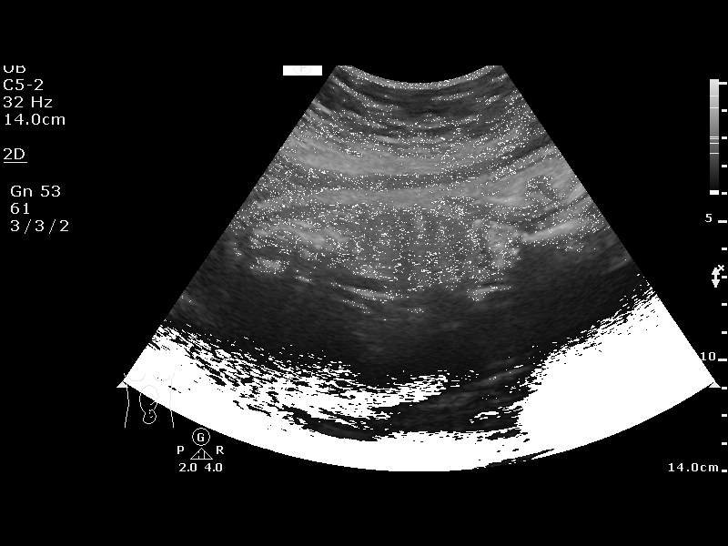
[im 13/14]
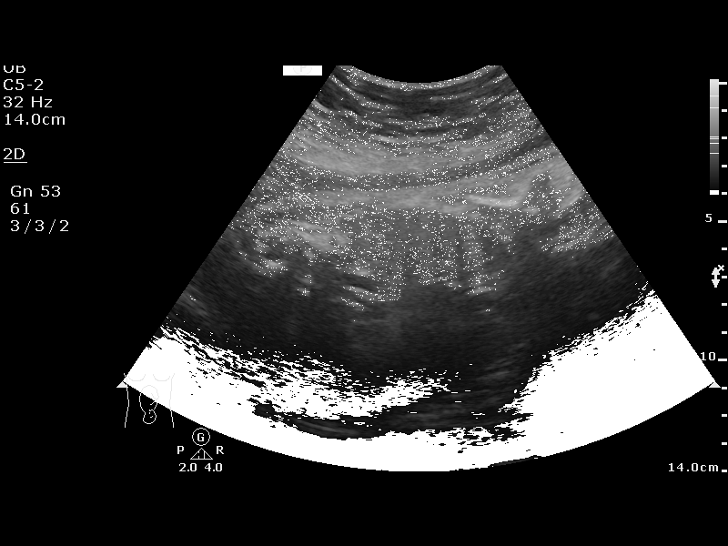
[im 14/14]
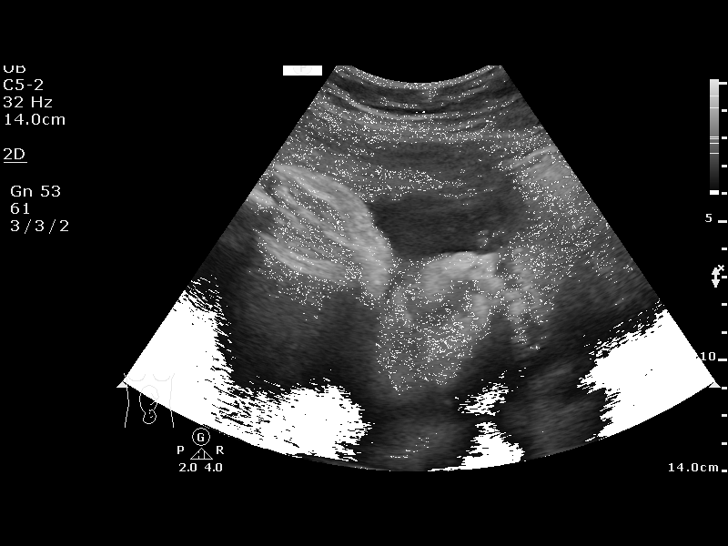

[13 of 14 positions shown; findings below may reference images not displayed]

am)

[facility]yn Clinic
Women[facility]
[REDACTED]

1  US FETAL BPP W/NONSTRESS                    76818.4

1  SABIR HUSSAIN CRISOSTOMO          [id number]      [phone]     [phone]
Service(s) Provided

Indications

36 weeks gestation of pregnancy
Unspecified pre-existing hypertension
complicating pregnancy, third trimester
OB History

Blood Type:            Height:  5'5"   Weight (lb):  224       BMI:
Gravidity:    4         Term:   3        Prem:   0        SAB:   0
TOP:          0       Ectopic:  0        Living: 3
Fetal Evaluation

Num Of Fetuses:     1
Preg. Location:     Intrauterine
Cardiac Activity:   Observed
Presentation:       Cephalic

Amniotic Fluid
AFI FV:      Subjectively within normal limits

AFI Sum(cm)     %Tile       Largest Pocket(cm)
12.9            45
RUQ(cm)       RLQ(cm)       LUQ(cm)        LLQ(cm)
3.54
Biophysical Evaluation

Amniotic F.V:   Pocket => 2 cm two         F. Tone:        Observed
planes
F. Movement:    Observed                   N.S.T:          Reactive
F. Breathing:   Observed                   Score:          [DATE]
Gestational Age

LMP:           38w 0d        Date:  03/21/17                 EDD:   12/26/17
Best:          36w 6d     Det. By:  U/S C R L  (06/26/17)    EDD:   01/03/18
Impression

Reassuring antenatal testing
Recommendations

Repeat testing one week

## 2019-03-01 IMAGING — US US MFM OB FOLLOW-UP
1 series · 13 of 28 positions shown · non-contrast
Comparison: none

[Series 1: us mfm ob follow-up · 41 acquisitions, 13 frames shown]
[im 2/41]
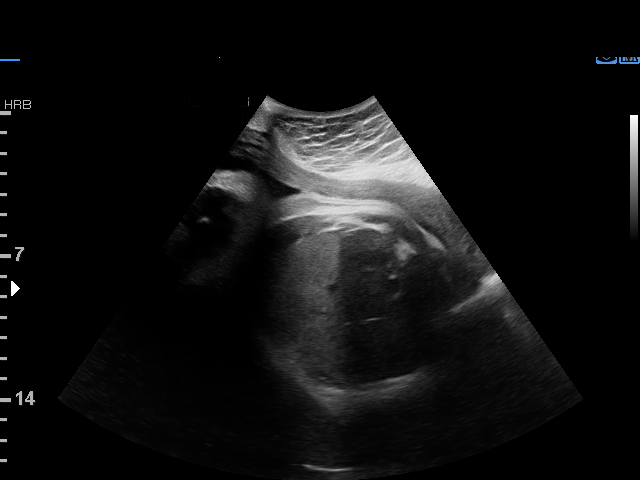
[im 5/41]
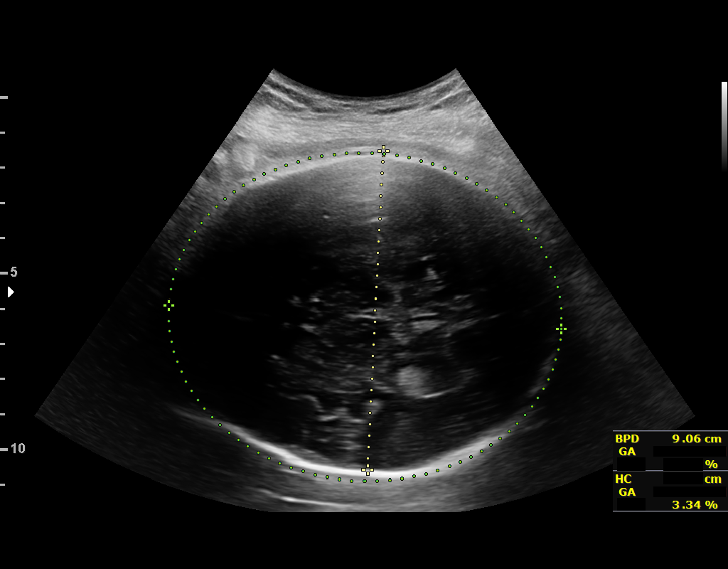
[im 8/41]
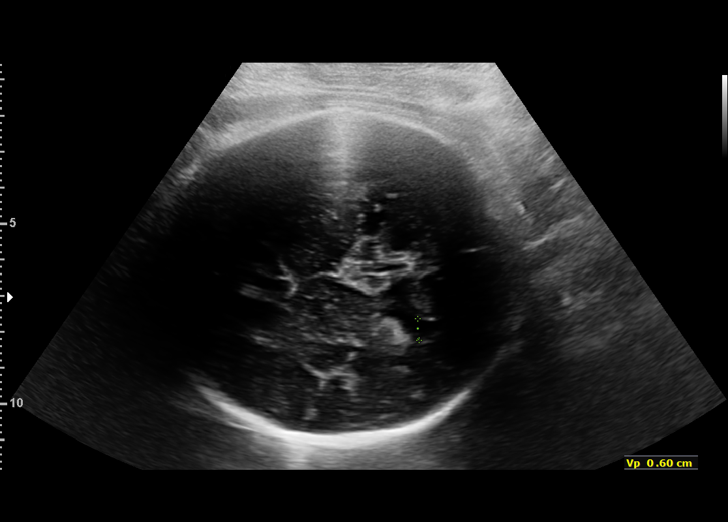
[im 11/41]
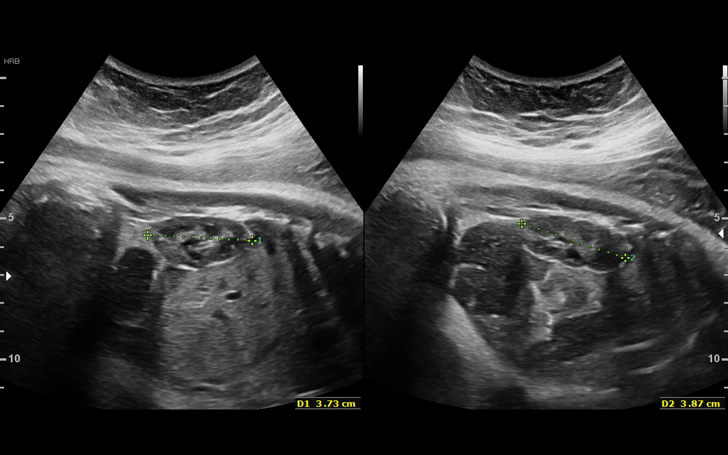
[im 14/41]
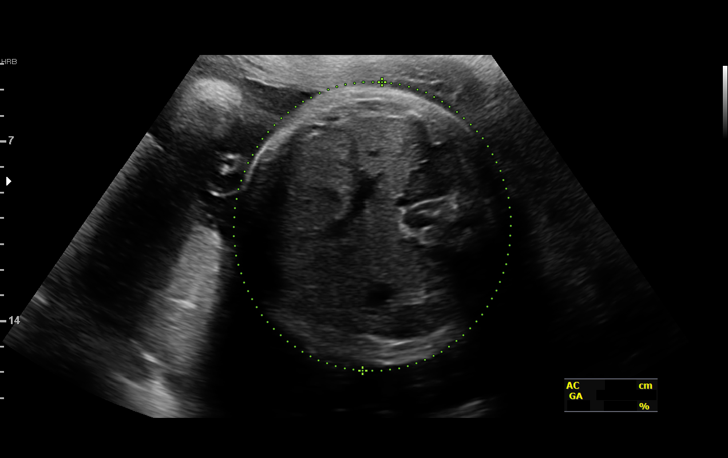
[im 17/41]
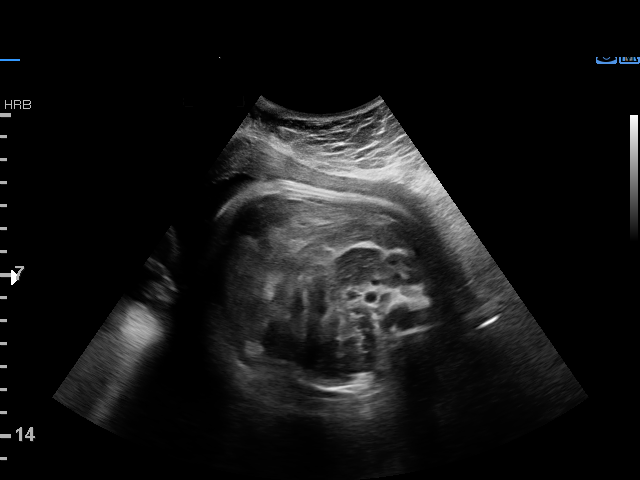
[im 21/41]
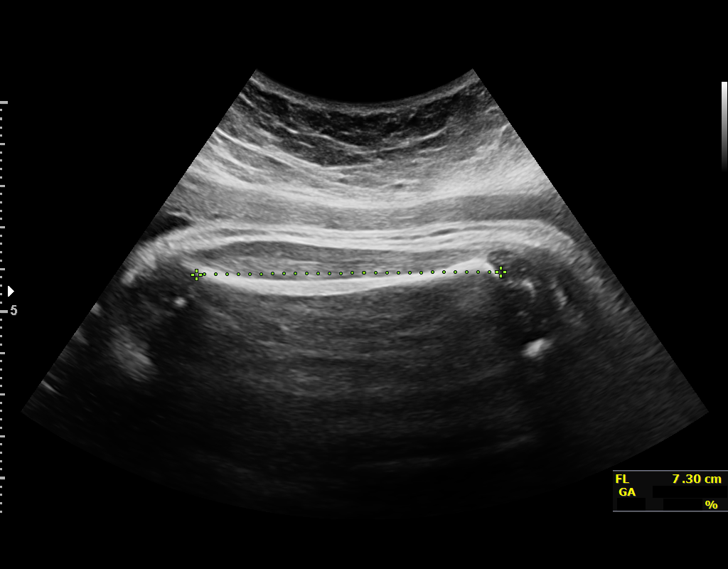
[im 24/41]
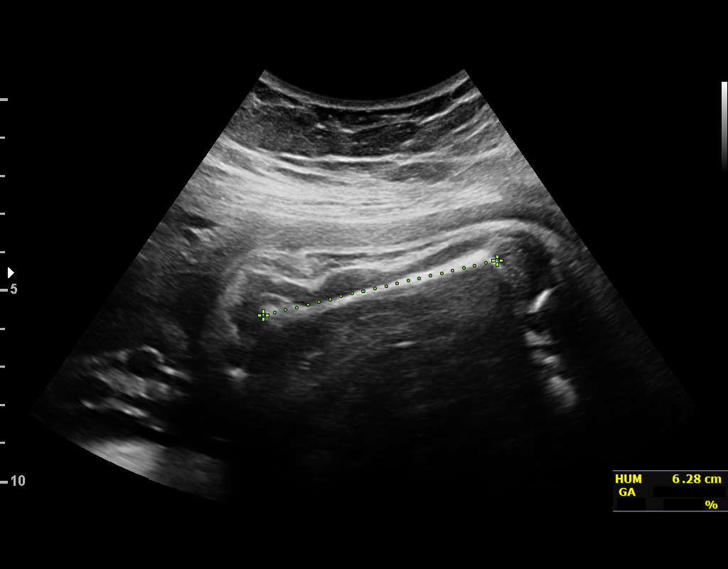
[im 27/41]
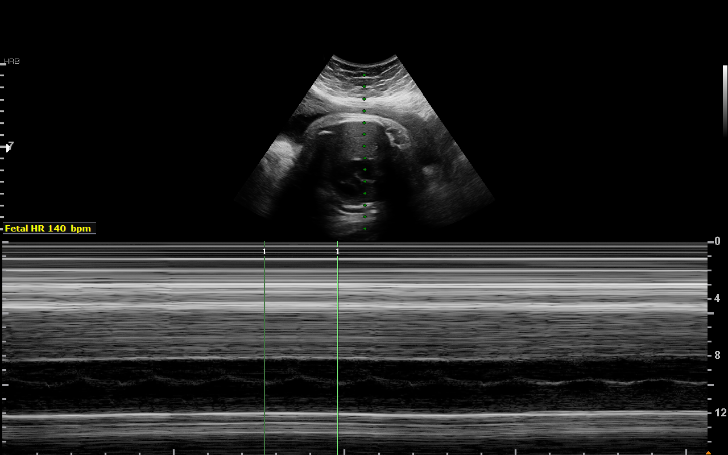
[im 30/41]
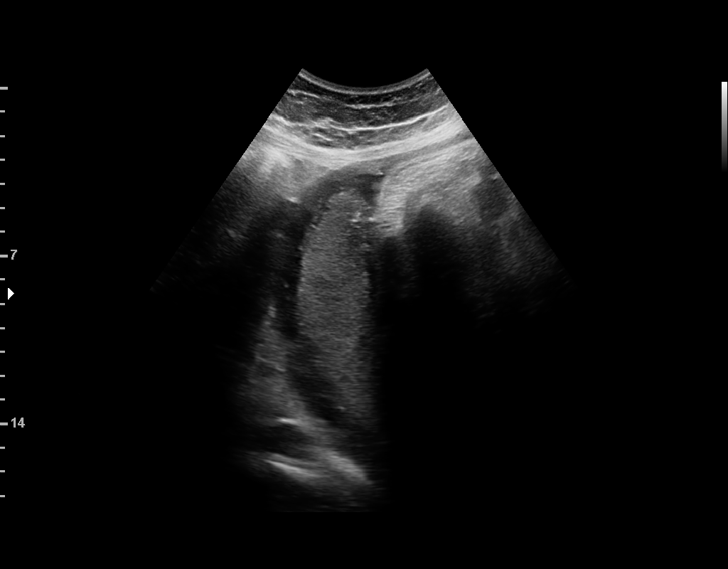
[im 33/41]
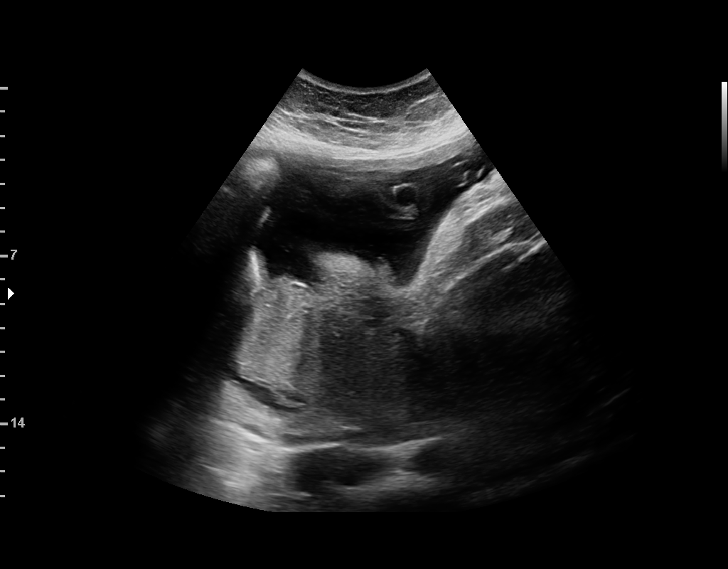
[im 36/41]
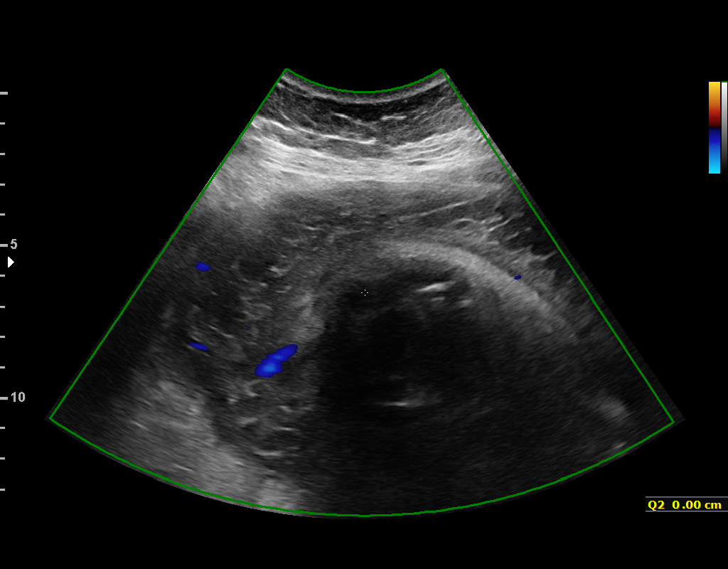
[im 39/41]
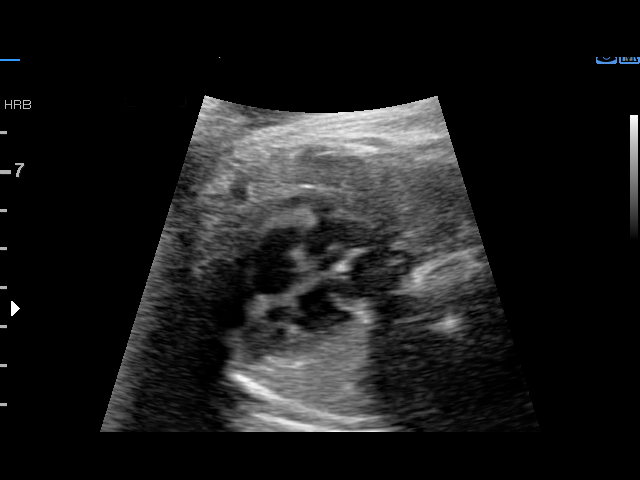

[13 of 28 positions shown; findings below may reference images not displayed]

OB/Gyn Clinic

1  SORIELLE ALBERTINE              [id number]      [id number]     [phone]
2  FAFA ALLY FABRICE KIFATA            [id number]      [id number]     [id number]
Indications

38 weeks gestation of pregnancy
Unspecified pre-existing hypertension
complicating pregnancy, third trimester
Encounter for other antenatal screening
follow-up
Obesity complicating pregnancy, third
trimester
OB History

Blood Type:            Height:  5'5"   Weight (lb):  224       BMI:
Gravidity:    4         Term:   3        Prem:   0        SAB:   0
TOP:          0       Ectopic:  0        Living: 3
Fetal Evaluation

Num Of Fetuses:     1
Fetal Heart         140
Rate(bpm):
Cardiac Activity:   Observed
Presentation:       Cephalic
Placenta:           Posterior, above cervical os
P. Cord Insertion:  Previously Visualized
Amniotic Fluid
AFI FV:      Subjectively low-normal

AFI Sum(cm)     %Tile       Largest Pocket(cm)
9.68            25

RUQ(cm)       RLQ(cm)       LUQ(cm)        LLQ(cm)
3.7           2.67          0
Biophysical Evaluation

Amniotic F.V:   Within normal limits       F. Tone:        Observed
F. Movement:    Observed                   Score:          [DATE]
F. Breathing:   Not Observed
Biometry

BPD:      90.8  mm     G. Age:  36w 6d         28  %    CI:        79.71   %    70 - 86
FL/HC:      22.6   %    20.6 -
HC:      321.4  mm     G. Age:  36w 2d        < 3  %    HC/AC:      0.93        0.87 -
AC:      343.9  mm     G. Age:  38w 2d         59  %    FL/BPD:     80.0   %    71 - 87
FL:       72.6  mm     G. Age:  37w 1d         20  %    FL/AC:      21.1   %    20 - 24
HUM:      62.4  mm     G. Age:  36w 1d         27  %

Est. FW:    0090  gm      7 lb 3 oz     61  %
Gestational Age

LMP:           39w 6d        Date:  03/21/17                 EDD:   12/26/17
U/S Today:     37w 1d                                        EDD:   01/14/18
Best:          38w 5d     Det. By:  U/S C R L  (06/26/17)    EDD:   01/03/18
Anatomy

Cranium:               Appears normal         Aortic Arch:            Previously seen
Cavum:                 Appears normal         Ductal Arch:            Previously seen
Ventricles:            Appears normal         Diaphragm:              Previously seen
Choroid Plexus:        Previously seen        Stomach:                Appears normal, left
sided
Cerebellum:            Previously seen        Abdomen:                Appears normal
Posterior Fossa:       Previously seen        Abdominal Wall:         Previously seen
Nuchal Fold:           Previously seen        Cord Vessels:           Previously seen
Face:                  Orbits and profile     Kidneys:                Appear normal
previously seen
Lips:                  Previously seen        Bladder:                Appears normal
Thoracic:              Appears normal         Spine:                  Previously seen
Heart:                 Appears normal         Upper Extremities:      Previously seen
(4CH, axis, and situs
RVOT:                  Previously seen        Lower Extremities:      Previously seen
LVOT:                  Previously seen

Other:  Fetus appears to be a female prev seen. Heels prev visualized. Nasal
bone prev visualized. Technically difficult due to maternal habitus and
fetal position.
Cervix Uterus Adnexa

Cervix
Not visualized (advanced GA >51wks)
Impression

Singleton intrauterine pregnancy at 38+5 weeks with CHTN
here for growthevaluation and BPP
Interval review of the anatomy shows no sonographic
markers for aneuploidy or structural anomalies
All relevant fetal anatomy has been visualized
Amniotic fluid volume is normal
Estimated fetal weight shows growth in the 61st percentile
BPP [DATE] with no fetal breathing
Recommendations

Scheduled for NST this AM in the clinic, sent there
immediately with precautions
No further US are necessary in the [facility]

## 2019-06-23 ENCOUNTER — Other Ambulatory Visit: Payer: Medicaid Other

## 2019-06-28 ENCOUNTER — Ambulatory Visit: Payer: Medicaid Other | Attending: Internal Medicine

## 2019-06-28 DIAGNOSIS — Z20822 Contact with and (suspected) exposure to covid-19: Secondary | ICD-10-CM

## 2019-06-30 LAB — NOVEL CORONAVIRUS, NAA: SARS-CoV-2, NAA: NOT DETECTED

## 2019-07-01 ENCOUNTER — Telehealth: Payer: Self-pay | Admitting: General Practice

## 2019-07-01 NOTE — Telephone Encounter (Signed)
Gave patient covid test results °Patient understood  °

## 2023-08-01 ENCOUNTER — Encounter: Payer: Self-pay | Admitting: Obstetrics & Gynecology

## 2023-08-01 ENCOUNTER — Other Ambulatory Visit (HOSPITAL_COMMUNITY)
Admission: RE | Admit: 2023-08-01 | Discharge: 2023-08-01 | Disposition: A | Discharge status: Home / Self Care | Payer: Commercial Managed Care - PPO | Source: Ambulatory Visit | Attending: Obstetrics & Gynecology | Admitting: Obstetrics & Gynecology

## 2023-08-01 ENCOUNTER — Ambulatory Visit: Payer: Commercial Managed Care - PPO | Admitting: Obstetrics & Gynecology

## 2023-08-01 VITALS — Ht 65.0 in | Wt 225.6 lb

## 2023-08-01 DIAGNOSIS — R87610 Atypical squamous cells of undetermined significance on cytologic smear of cervix (ASC-US): Secondary | ICD-10-CM | POA: Insufficient documentation

## 2023-08-01 DIAGNOSIS — R87810 Cervical high risk human papillomavirus (HPV) DNA test positive: Secondary | ICD-10-CM

## 2023-08-01 NOTE — Progress Notes (Signed)
    GYNECOLOGY PROGRESS NOTE  Subjective:    Patient ID: Laura Downs, female    DOB: 1985/12/10, 38 y.o.   MRN: [id number]  HPI  Patient is a 38 y.o. [id number] here as a new patient as a referral from the health dept for colposcopy due to a ASCUS + HR HPV (E6/7) 06/2023. She reports a h/o a LEEP in 2008.    She had a BTL in the past.  The following portions of the patient's history were reviewed and updated as appropriate: allergies, current medications, past family history, past medical history, past social history, past surgical history, and problem list.  Review of Systems Pertinent items are noted in HPI.   Objective:   Height 5\' 5"  (1.651 m), weight 225 lb 9.6 oz (102.3 kg), last menstrual period 07/18/2023. Body mass index is 37.54 kg/m. Well nourished, well hydrated Black female, no apparent distress She is ambulating and conversing normally. Consent signed, time out done Speculum placed. Please note that a [place]man spec was the proper size. Her cervix points posteriorly and the posterior lip is significantly smaller than the anterior lip (changes c/w previous LEEP) Cervix prepped with acetic acid. Transformation zone seen in its entirety. Colpo adequate. Colposcopic findings normal. ECC obtained. She tolerated the procedure well.    Assessment:   1. ASCUS with positive high risk HPV cervical      Plan:   1. ASCUS with positive high risk HPV cervical (Primary)   - await pathology report - If she needs another LEEP or a CKC, then visualization would be improved in the OR with anesthesia (due to the length of her vagina and the posterior position of her cervix.

## 2023-08-04 ENCOUNTER — Encounter: Payer: Self-pay | Admitting: Obstetrics & Gynecology

## 2023-08-04 LAB — SURGICAL PATHOLOGY

## 2024-07-30 ENCOUNTER — Other Ambulatory Visit: Payer: Self-pay

## 2024-07-30 ENCOUNTER — Encounter (HOSPITAL_COMMUNITY): Payer: Self-pay

## 2024-07-30 ENCOUNTER — Emergency Department (HOSPITAL_COMMUNITY)

## 2024-07-30 ENCOUNTER — Emergency Department (HOSPITAL_COMMUNITY)
Admission: EM | Admit: 2024-07-30 | Discharge: 2024-07-30 | Disposition: A | Discharge status: Home / Self Care | Attending: Emergency Medicine | Admitting: Emergency Medicine

## 2024-07-30 DIAGNOSIS — I10 Essential (primary) hypertension: Secondary | ICD-10-CM | POA: Diagnosis not present

## 2024-07-30 DIAGNOSIS — R519 Headache, unspecified: Secondary | ICD-10-CM | POA: Insufficient documentation

## 2024-07-30 LAB — HCG, SERUM, QUALITATIVE: Preg, Serum: NEGATIVE

## 2024-07-30 LAB — CBC WITH DIFFERENTIAL/PLATELET
Abs Immature Granulocytes: 0.01 10*3/uL (ref 0.00–0.07)
Basophils Absolute: 0 10*3/uL (ref 0.0–0.1)
Basophils Relative: 0 %
Eosinophils Absolute: 0.1 10*3/uL (ref 0.0–0.5)
Eosinophils Relative: 1 %
HCT: 42.1 % (ref 36.0–46.0)
Hemoglobin: 14.5 g/dL (ref 12.0–15.0)
Immature Granulocytes: 0 %
Lymphocytes Relative: 38 %
Lymphs Abs: 1.6 10*3/uL (ref 0.7–4.0)
MCH: 31.3 pg (ref 26.0–34.0)
MCHC: 34.4 g/dL (ref 30.0–36.0)
MCV: 90.9 fL (ref 80.0–100.0)
Monocytes Absolute: 0.5 10*3/uL (ref 0.1–1.0)
Monocytes Relative: 12 %
Neutro Abs: 2 10*3/uL (ref 1.7–7.7)
Neutrophils Relative %: 49 %
Platelets: 289 10*3/uL (ref 150–400)
RBC: 4.63 MIL/uL (ref 3.87–5.11)
RDW: 12.8 % (ref 11.5–15.5)
WBC: 4.1 10*3/uL (ref 4.0–10.5)
nRBC: 0 % (ref 0.0–0.2)

## 2024-07-30 LAB — BASIC METABOLIC PANEL WITH GFR
Anion gap: 11 (ref 5–15)
BUN: 9 mg/dL (ref 6–20)
CO2: 23 mmol/L (ref 22–32)
Calcium: 9.1 mg/dL (ref 8.9–10.3)
Chloride: 101 mmol/L (ref 98–111)
Creatinine, Ser: 0.77 mg/dL (ref 0.44–1.00)
GFR, Estimated: >60 mL/min
Glucose, Bld: 97 mg/dL (ref 70–99)
Potassium: 3.5 mmol/L (ref 3.5–5.1)
Sodium: 134 mmol/L — ABNORMAL LOW (ref 135–145)

## 2024-07-30 MED ORDER — MAGNESIUM SULFATE 2 GM/50ML IV SOLN
2.0000 g | Freq: Once | INTRAVENOUS | Status: AC
  Administered 2024-07-30: 2 g via INTRAVENOUS
  Filled 2024-07-30: qty 50

## 2024-07-30 MED ORDER — SODIUM CHLORIDE 0.9 % IV BOLUS
500.0000 mL | Freq: Once | INTRAVENOUS | Status: AC
  Administered 2024-07-30: 500 mL via INTRAVENOUS

## 2024-07-30 MED ORDER — KETOROLAC TROMETHAMINE 30 MG/ML IJ SOLN
30.0000 mg | Freq: Once | INTRAMUSCULAR | Status: AC
  Administered 2024-07-30: 30 mg via INTRAVENOUS
  Filled 2024-07-30: qty 1

## 2024-07-30 MED ORDER — ONDANSETRON HCL 4 MG/2ML IJ SOLN
4.0000 mg | Freq: Once | INTRAMUSCULAR | Status: AC
  Administered 2024-07-30: 4 mg via INTRAVENOUS
  Filled 2024-07-30: qty 2

## 2024-07-30 MED ORDER — ACETAMINOPHEN 500 MG PO TABS
1000.0000 mg | ORAL_TABLET | Freq: Once | ORAL | Status: AC
  Administered 2024-07-30: 1000 mg via ORAL
  Filled 2024-07-30: qty 2

## 2024-07-30 NOTE — ED Triage Notes (Signed)
 Pt BIB [facility] from work d/t a HA with spotty vision that started yesterday. Does have Hx of HTN & reports not taking her meds &/or diuretic as prescribed. When the HA began she took her BP meds & an Ibuprofen  & the HA has not subsided (per EMS). Denies CP, dizziness, A/Ox4.

## 2024-07-30 NOTE — ED Provider Notes (Signed)
 "  Emergency Department Provider Note   I have reviewed the triage vital signs and the nursing notes.   HISTORY  Chief Complaint Hypertension and Headache   HPI Laura Downs is a 39 y.o. female with PMH of HTN presents to the urgency department with frontal headache since yesterday not responding to home medications.  She has some associated spots in the vision which are moving and transient.  No sudden/severe headache symptoms or visual field loss but headache has not responded to ibuprofen .  She is poorly compliant with her blood pressure medication by her report but did take it yesterday.  She is not having any numbness or weakness.  No speech difficulty.  No dizziness, chest pain, near syncope. No migraine history.   Past Medical History:  Diagnosis Date   Depression    current pregnancy   Hypertension    Vaginal Pap smear, abnormal     Review of Systems  Constitutional: No fever/chills Eyes: Positive transient spots in vision.  Cardiovascular: Denies chest pain. Respiratory: Denies shortness of breath. Gastrointestinal: No abdominal pain.  No nausea, no vomiting.   Neurological: Negative for headaches.  ____________________________________________   PHYSICAL EXAM:  VITAL SIGNS: ED Triage Vitals  Encounter Vitals Group     BP 07/30/24 0845 (!) 146/90     Pulse Rate 07/30/24 0845 74     Resp 07/30/24 0845 19     Temp 07/30/24 0845 98.3 F (36.8 C)     Temp Source 07/30/24 0845 Oral     SpO2 07/30/24 0845 100 %     Weight 07/30/24 0850 215 lb (97.5 kg)     Height 07/30/24 0850 5' 3 (1.6 m)   Constitutional: Alert and oriented. Well appearing and in no acute distress. Eyes: Conjunctivae are normal. PERRL. EOMI. No visual field deficits.  Head: Atraumatic. Nose: No congestion/rhinnorhea. Mouth/Throat: Mucous membranes are moist.  Neck: No stridor.   Cardiovascular: Normal rate, regular rhythm. Good peripheral circulation. Grossly normal heart sounds.    Respiratory: Normal respiratory effort.  No retractions. Lungs CTAB. Gastrointestinal: Soft and nontender. No distention.  Musculoskeletal: No lower extremity tenderness nor edema. No gross deformities of extremities. Neurologic:  Normal speech and language. No gross focal neurologic deficits are appreciated. 5/5 strength in the bilateral upper and lower extremities.  Skin:  Skin is warm, dry and intact. No rash noted.   ____________________________________________   LABS (all labs ordered are listed, but only abnormal results are displayed)  Labs Reviewed  BASIC METABOLIC PANEL WITH GFR - Abnormal; Notable for the following components:      Result Value   Sodium 134 (*)    All other components within normal limits  HCG, SERUM, QUALITATIVE  CBC WITH DIFFERENTIAL/PLATELET   ____________________________________________  RADIOLOGY  CT Head Wo Contrast Result Date: 07/30/2024 EXAM: CT HEAD WITHOUT CONTRAST 07/30/2024 09:51:57 AM TECHNIQUE: CT of the head was performed without the administration of intravenous contrast. Automated exposure control, iterative reconstruction, and/or weight based adjustment of the mA/kV was utilized to reduce the radiation dose to as low as reasonably achievable. COMPARISON: None available. CLINICAL HISTORY: Headache, increasing frequency or severity. FINDINGS: BRAIN AND VENTRICLES: No acute hemorrhage. No evidence of acute infarct. No hydrocephalus. No extra-axial collection. No mass effect or midline shift. Partially empty sella. ORBITS: No acute abnormality. SINUSES: No acute abnormality. SOFT TISSUES AND SKULL: No acute soft tissue abnormality. No skull fracture. IMPRESSION: 1. No acute intracranial abnormality. 2. Partially empty sella. Electronically signed by: Evalene Coho MD  07/30/2024 10:07 AM EST RP Workstation: [id number]    ____________________________________________   PROCEDURES  Procedure(s) performed:   Procedures  None   ____________________________________________   INITIAL IMPRESSION / ASSESSMENT AND PLAN / ED COURSE  Pertinent labs & imaging results that were available during my care of the patient were reviewed by me and considered in my medical decision making (see chart for details).   This patient is Presenting for Evaluation of HA, which does require a range of treatment options, and is a complaint that involves a high risk of morbidity and mortality.  The Differential Diagnoses includes but is not exclusive to subarachnoid hemorrhage, meningitis, encephalitis, previous head trauma, cavernous venous thrombosis, muscle tension headache, glaucoma, temporal arteritis, migraine or migraine equivalent, etc.   Critical Interventions-    Medications  sodium chloride  0.9 % bolus 500 mL (500 mLs Intravenous New Bag/Given 07/30/24 0933)  magnesium  sulfate IVPB 2 g 50 mL (0 g Intravenous Stopped 07/30/24 0946)  ondansetron  (ZOFRAN ) injection 4 mg (4 mg Intravenous Given 07/30/24 0931)  acetaminophen  (TYLENOL ) tablet 1,000 mg (1,000 mg Oral Given 07/30/24 0932)  ketorolac  (TORADOL ) 30 MG/ML injection 30 mg (30 mg Intravenous Given 07/30/24 1036)    Reassessment after intervention: symptoms improved.    Clinical Laboratory Tests Ordered, included pregnancy negative.  CBC without leukocytosis or anemia.  Basic metabolic shows normal kidney function and potassium.  Radiologic Tests Ordered, included CT head. I independently interpreted the images and agree with radiology interpretation.   Cardiac Monitor Tracing which shows NSR.    Social Determinants of Health Risk patient is a non-smoker.   Medical Decision Making: Summary:  Patient presents to the emergency department for evaluation of headache with some transient spots in the vision.  No focal neurodeficits but given persistent, atypical headache with some vision symptoms and no prior head imaging plan for CT head along with screening blood work to  evaluate for endorgan damage related to hypertension.  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  May benefit from outpatient referral.  Reevaluation with update and discussion with patient.  Headache symptoms are improving.  Workup largely unremarkable.  Suspect more persistent/worsening migraines with auras.  Plan for outpatient neurology referral in the setting with PCP contact information.  Discussed ED return precautions.   Patient's presentation is most consistent with acute presentation with potential threat to life or bodily function.   Disposition: discharge  ____________________________________________  FINAL CLINICAL IMPRESSION(S) / ED DIAGNOSES  Final diagnoses:  Acute nonintractable headache, unspecified headache type     Note:  This document was prepared using Dragon voice recognition software and may include unintentional dictation errors.  Fonda Law, MD, [facility]    Linh Hedberg, Fonda MATSU, MD 07/30/24 1052  "

## 2024-07-30 NOTE — Discharge Instructions (Signed)

## 2024-08-23 ENCOUNTER — Ambulatory Visit: Admitting: Neurology
